# Patient Record
Sex: Female | Born: 1940 | Race: White | Hispanic: No | Smoking: Never smoker
Health system: Southern US, Community
[De-identification: ages and names within clinical notes are randomized; demographics above are authoritative.]

---

## 2015-01-07 ENCOUNTER — Emergency Department (HOSPITAL_COMMUNITY)
Admission: EM | Admit: 2015-01-07 | Discharge: 2015-01-07 | Disposition: A | Discharge status: Home / Self Care | Payer: Medicare Other | Attending: Emergency Medicine | Admitting: Emergency Medicine

## 2015-01-07 ENCOUNTER — Encounter (HOSPITAL_COMMUNITY): Payer: Self-pay | Admitting: Emergency Medicine

## 2015-01-07 ENCOUNTER — Emergency Department (HOSPITAL_COMMUNITY): Payer: Medicare Other

## 2015-01-07 DIAGNOSIS — Y999 Unspecified external cause status: Secondary | ICD-10-CM | POA: Insufficient documentation

## 2015-01-07 DIAGNOSIS — S60811A Abrasion of right wrist, initial encounter: Secondary | ICD-10-CM | POA: Insufficient documentation

## 2015-01-07 DIAGNOSIS — Y9389 Activity, other specified: Secondary | ICD-10-CM | POA: Diagnosis not present

## 2015-01-07 DIAGNOSIS — S62101B Fracture of unspecified carpal bone, right wrist, initial encounter for open fracture: Secondary | ICD-10-CM

## 2015-01-07 DIAGNOSIS — S52571B Other intraarticular fracture of lower end of right radius, initial encounter for open fracture type I or II: Secondary | ICD-10-CM | POA: Diagnosis not present

## 2015-01-07 DIAGNOSIS — W01198A Fall on same level from slipping, tripping and stumbling with subsequent striking against other object, initial encounter: Secondary | ICD-10-CM | POA: Insufficient documentation

## 2015-01-07 DIAGNOSIS — Z23 Encounter for immunization: Secondary | ICD-10-CM | POA: Insufficient documentation

## 2015-01-07 DIAGNOSIS — Y9289 Other specified places as the place of occurrence of the external cause: Secondary | ICD-10-CM | POA: Diagnosis not present

## 2015-01-07 DIAGNOSIS — S6991XA Unspecified injury of right wrist, hand and finger(s), initial encounter: Secondary | ICD-10-CM | POA: Diagnosis present

## 2015-01-07 LAB — BASIC METABOLIC PANEL
ANION GAP: 10 (ref 5–15)
BUN: 15 mg/dL (ref 6–20)
CALCIUM: 9.6 mg/dL (ref 8.9–10.3)
CO2: 27 mmol/L (ref 22–32)
Chloride: 99 mmol/L — ABNORMAL LOW (ref 101–111)
Creatinine, Ser: 1.04 mg/dL — ABNORMAL HIGH (ref 0.44–1.00)
GFR calc non Af Amer: 52 mL/min — ABNORMAL LOW (ref >60)
GFR, EST AFRICAN AMERICAN: 60 mL/min — AB (ref >60)
Glucose, Bld: 93 mg/dL (ref 65–99)
Potassium: 4.2 mmol/L (ref 3.5–5.1)
SODIUM: 136 mmol/L (ref 135–145)

## 2015-01-07 LAB — CBC WITH DIFFERENTIAL/PLATELET
BASOS ABS: 0 10*3/uL (ref 0.0–0.1)
BASOS PCT: 0 % (ref 0–1)
Eosinophils Absolute: 0.1 10*3/uL (ref 0.0–0.7)
Eosinophils Relative: 1 % (ref 0–5)
HEMATOCRIT: 35 % — AB (ref 36.0–46.0)
HEMOGLOBIN: 11.7 g/dL — AB (ref 12.0–15.0)
Lymphocytes Relative: 11 % — ABNORMAL LOW (ref 12–46)
Lymphs Abs: 1.1 10*3/uL (ref 0.7–4.0)
MCH: 31 pg (ref 26.0–34.0)
MCHC: 33.4 g/dL (ref 30.0–36.0)
MCV: 92.6 fL (ref 78.0–100.0)
MONO ABS: 0.5 10*3/uL (ref 0.1–1.0)
Monocytes Relative: 5 % (ref 3–12)
NEUTROS ABS: 8.7 10*3/uL — AB (ref 1.7–7.7)
NEUTROS PCT: 83 % — AB (ref 43–77)
Platelets: 177 10*3/uL (ref 150–400)
RBC: 3.78 MIL/uL — ABNORMAL LOW (ref 3.87–5.11)
RDW: 13.9 % (ref 11.5–15.5)
WBC: 10.4 10*3/uL (ref 4.0–10.5)

## 2015-01-07 MED ORDER — CEFAZOLIN SODIUM 1-5 GM-% IV SOLN
1.0000 g | Freq: Once | INTRAVENOUS | Status: AC
  Administered 2015-01-07: 1 g via INTRAVENOUS

## 2015-01-07 MED ORDER — CEFAZOLIN SODIUM 1-5 GM-% IV SOLN
1.0000 g | Freq: Once | INTRAVENOUS | Status: DC
  Filled 2015-01-07: qty 50

## 2015-01-07 MED ORDER — BUPIVACAINE HCL (PF) 0.5 % IJ SOLN
20.0000 mL | Freq: Once | INTRAMUSCULAR | Status: AC
  Administered 2015-01-07: 20 mL
  Filled 2015-01-07: qty 20

## 2015-01-07 MED ORDER — TETANUS-DIPHTH-ACELL PERTUSSIS 5-2.5-18.5 LF-MCG/0.5 IM SUSP
0.5000 mL | Freq: Once | INTRAMUSCULAR | Status: AC
  Administered 2015-01-07: 0.5 mL via INTRAMUSCULAR
  Filled 2015-01-07: qty 0.5

## 2015-01-07 NOTE — ED Provider Notes (Signed)
CSN: [id number]     Arrival date & time 01/07/15  0806 History   First MD Initiated Contact with Patient 01/07/15 1012     Chief Complaint  Patient presents with  . Fall  . Wrist Pain     (Consider location/radiation/quality/duration/timing/severity/associated sxs/prior Treatment) HPI   Medicare pt, last ate last night. Madison Young 74 y.o.female  PCP: No primary care provider on file.  Pulse 61, temperature 97.8 F (36.6 C), temperature source Oral, resp. rate 16, height 5' 1.75" (1.568 m), weight 98 lb (44.453 kg), SpO2 97 %.  SIGNIFICANT PMH: None CHIEF COMPLAINT: wrist pain  When: 8:30 am this morning How: patient was walking and tripped on her shoelaces, fell landing on her right wrist Chronicity: acute Location: right wrist Radiation: None Quality and severity: Pt denies pain or wanting pan medications Alleviating factors: wrist Worsening factors: movement and touching Treatments tried: ice and rest Associated Symptoms: pain Negative ROS: Confusion, diaphoresis, fever, headache, weakness (general or focal), change of vision,  neck pain, dysphagia, aphagia, chest pain, shortness of breath,  back pain, abdominal pains, nausea, vomiting, diarrhea, lower extremity swelling, rash.   Denies head or neck injury, no loc.   History reviewed. No pertinent past medical history. History reviewed. No pertinent past surgical history. No family history on file. Social History  Substance Use Topics  . Smoking status: Never Smoker   . Smokeless tobacco: None  . Alcohol Use: Yes   OB History    No data available     Review of Systems  10 Systems reviewed and are negative for acute change except as noted in the HPI.     Allergies  Review of patient's allergies indicates no known allergies.  Home Medications   Prior to Admission medications   Not on File   BP 134/64 mmHg  Pulse 60  Temp(Src) 97.8 F (36.6 C) (Oral)  Resp 16  Ht 5' 1.75" (1.568 m)  Wt 98 lb  (44.453 kg)  BMI 18.08 kg/m2  SpO2 100% Physical Exam  Constitutional: She appears well-developed and well-nourished. No distress.  HENT:  Head: Normocephalic and atraumatic.  Eyes: Pupils are equal, round, and reactive to light.  Neck: Normal range of motion. Neck supple.  Cardiovascular: Normal rate and regular rhythm.   Pulmonary/Chest: Effort normal.  Abdominal: Soft.  Musculoskeletal:       Right wrist: She exhibits decreased range of motion, tenderness, bony tenderness, swelling, effusion, crepitus and deformity (abrasion overlying the distal ulnar head. No exposed bone. but + open fracture). She exhibits no laceration.  Cap refill less than 3 seconds Radial pulse palpable.  Neurological: She is alert.  Skin: Skin is warm and dry.  Nursing note and vitals reviewed.     ED Course  Reduction of fracture Date/Time: 01/07/2015 12:59 PM Performed by: Marlon Pel Authorized by: Marlon Pel Consent: Verbal consent obtained. Risks and benefits: risks, benefits and alternatives were discussed Consent given by: patient Patient understanding: patient states understanding of the procedure being performed Patient consent: the patient's understanding of the procedure matches consent given Patient identity confirmed: verbally with patient and arm band Time out: Immediately prior to procedure a "time out" was called to verify the correct patient, procedure, equipment, support staff and site/side marked as required. Preparation: Patient was prepped and draped in the usual sterile fashion. Local anesthesia used: yes Anesthesia: hematoma block Local anesthetic: bupivacaine 0.5% without epinephrine Anesthetic total: 10 ml Patient sedated: no Patient tolerance: Patient tolerated the procedure well with no  immediate complications Comments: Significant improvement in anatomic alignment. Good color. Radial pulse intact.   (including critical care time) Labs Review Labs Reviewed   CBC WITH DIFFERENTIAL/PLATELET - Abnormal; Notable for the following:    RBC 3.78 (*)    Hemoglobin 11.7 (*)    HCT 35.0 (*)    Neutrophils Relative % 83 (*)    Neutro Abs 8.7 (*)    Lymphocytes Relative 11 (*)    All other components within normal limits  BASIC METABOLIC PANEL - Abnormal; Notable for the following:    Chloride 99 (*)    Creatinine, Ser 1.04 (*)    GFR calc non Af Amer 52 (*)    GFR calc Af Amer 60 (*)    All other components within normal limits    Imaging Review Dg Wrist Complete Right  01/07/2015   CLINICAL DATA:  Slipped and fell  EXAM: RIGHT WRIST - COMPLETE 3+ VIEW  COMPARISON:  None.  FINDINGS: There is a comminuted intra-articular fracture of the distal right radius. Positive ulnar variance is noted with abnormal articulation of the ulna with the carpus and may indicate ligamentous injury. Widening of the scapholunate distance is present measuring 4 mm. Degenerative change at the articulation of the scaphoid and trapezium noted. Apex dorsal angulation of the fracture fragments.  IMPRESSION: Complex distal radial intra-articular fracture with positive ulnar variance and suggestion of scapholunate ligament tear.   Electronically Signed   By: Christiana Pellant M.D.   On: 01/07/2015 09:18   I have personally reviewed and evaluated these images and lab results as part of my medical decision-making.   EKG Interpretation None     MDM   Final diagnoses:  Open wrist fracture, right, initial encounter   Patients xray shows  IMPRESSION: Complex distal radial intra-articular fracture with positive ulnar variance and suggestion of scapholunate ligament tear.   Electronically Signed   By: Christiana Pellant M.D.   On: 01/07/2015 09:18   Discussed case with Dr. Clydene Pugh. We will call Hand Surgery.  Dr Clydene Pugh spoke with Dr. Izora Ribas and told pt has open fx- he recommends Ancef and f/u as outpatient.   Will attempt to fixate wrist in ED with Dr. Clydene Pugh using arthralgic block with  marcaine.   Patient placed in finger traction and had significant improvement of deformity. Wound cleaned thoroughly and covered with non stick gauze.Splint placed by Ortho Technician. She was offered and Rx for pain medication but requests I do not write for it because her son has addiction problems and is afraid to get a prescription.  Medications  ceFAZolin (ANCEF) IVPB 1 g/50 mL premix (not administered)  Tdap (BOOSTRIX) injection 0.5 mL (not administered)  bupivacaine (MARCAINE) 0.5 % injection 20 mL (20 mLs Infiltration Given by Other 01/07/15 1133)    74 y.o.Talbert Forest Cifelli's evaluation in the Emergency Department is complete. It has been determined that no acute conditions requiring further emergency intervention are present at this time. The patient/guardian have been advised of the diagnosis and plan. We have discussed signs and symptoms that warrant return to the ED, such as changes or worsening in symptoms.  Vital signs are stable at discharge. Filed Vitals:   01/07/15 1210  BP: 134/64  Pulse: 60  Temp:   Resp: 16    Patient/guardian has voiced understanding and agreed to follow-up with the PCP or specialist.    Marlon Pel, PA-C 01/07/15 1302  Lyndal Pulley, MD 01/07/15 2110

## 2015-01-07 NOTE — Discharge Instructions (Signed)
Cast or Splint Care °Casts and splints support injured limbs and keep bones from moving while they heal. It is important to care for your cast or splint at home.   °HOME CARE INSTRUCTIONS °· Keep the cast or splint uncovered during the drying period. It can take 24 to 48 hours to dry if it is made of plaster. A fiberglass cast will dry in less than 1 hour. °· Do not rest the cast on anything harder than a pillow for the first 24 hours. °· Do not put weight on your injured limb or apply pressure to the cast until your health care provider gives you permission. °· Keep the cast or splint dry. Wet casts or splints can lose their shape and may not support the limb as well. A wet cast that has lost its shape can also create harmful pressure on your skin when it dries. Also, wet skin can become infected. °· Cover the cast or splint with a plastic bag when bathing or when out in the rain or snow. If the cast is on the trunk of the body, take sponge baths until the cast is removed. °· If your cast does become wet, dry it with a towel or a blow dryer on the cool setting only. °· Keep your cast or splint clean. Soiled casts may be wiped with a moistened cloth. °· Do not place any hard or soft foreign objects under your cast or splint, such as cotton, toilet paper, lotion, or powder. °· Do not try to scratch the skin under the cast with any object. The object could get stuck inside the cast. Also, scratching could lead to an infection. If itching is a problem, use a blow dryer on a cool setting to relieve discomfort. °· Do not trim or cut your cast or remove padding from inside of it. °· Exercise all joints next to the injury that are not immobilized by the cast or splint. For example, if you have a long leg cast, exercise the hip joint and toes. If you have an arm cast or splint, exercise the shoulder, elbow, thumb, and fingers. °· Elevate your injured arm or leg on 1 or 2 pillows for the first 1 to 3 days to decrease  swelling and pain. It is best if you can comfortably elevate your cast so it is higher than your heart. °SEEK MEDICAL CARE IF:  °· Your cast or splint cracks. °· Your cast or splint is too tight or too loose. °· You have unbearable itching inside the cast. °· Your cast becomes wet or develops a soft spot or area. °· You have a bad smell coming from inside your cast. °· You get an object stuck under your cast. °· Your skin around the cast becomes red or raw. °· You have new pain or worsening pain after the cast has been applied. °SEEK IMMEDIATE MEDICAL CARE IF:  °· You have fluid leaking through the cast. °· You are unable to move your fingers or toes. °· You have discolored (blue or white), cool, painful, or very swollen fingers or toes beyond the cast. °· You have tingling or numbness around the injured area. °· You have severe pain or pressure under the cast. °· You have any difficulty with your breathing or have shortness of breath. °· You have chest pain. °Document Released: 04/11/2000 Document Revised: 02/02/2013 Document Reviewed: 10/21/2012 °ExitCare® Patient Information ©2015 [employer]. This information is not intended to replace advice given to you by your health care   provider. Make sure you discuss any questions you have with your health care provider. ° °Wrist Fracture °A wrist fracture is a break or crack in one of the bones of your wrist. Your wrist is made up of eight small bones at the palm of your hand (carpal bones) and two long bones that make up your forearm (radius and ulna).  °CAUSES  °· A direct blow to the wrist. °· Falling on an outstretched hand. °· Trauma, such as a car accident or a fall. °RISK FACTORS °Risk factors for wrist fracture include:  °· Participating in contact and high-risk sports, such as skiing, biking, and ice skating. °· Taking steroid medicines. °· Smoking. °· Being female. °· Being Caucasian. °· Drinking more than three alcoholic beverages per day. °· Having low or  lowered bone density (osteoporosis or osteopenia). °· Age. Older adults have decreased bone density. °· Women who have had menopause. °· History of previous fractures. °SIGNS AND SYMPTOMS °Symptoms of wrist fractures include tenderness, bruising, and inflammation. Additionally, the wrist may hang in an odd position or appear deformed.  °DIAGNOSIS °Diagnosis may include: °· Physical exam. °· X-ray. °TREATMENT °Treatment depends on many factors, including the nature and location of the fracture, your age, and your activity level. Treatment for wrist fracture can be nonsurgical or surgical.  °Nonsurgical Treatment °A plaster cast or splint may be applied to your wrist if the bone is in a good position. If the fracture is not in good position, it may be necessary for your health care provider to realign it before applying a splint or cast. Usually, a cast or splint will be worn for several weeks.  °Surgical Treatment °Sometimes the position of the bone is so far out of place that surgery is required to apply a device to hold it together as it heals. Depending on the fracture, there are a number of options for holding the bone in place while it heals, such as a cast and metal pins.   °HOME CARE INSTRUCTIONS °· Keep your injured wrist elevated and move your fingers as much as possible. °· Do not put pressure on any part of your cast or splint. It may break.   °· Use a plastic bag to protect your cast or splint from water while bathing or showering. Do not lower your cast or splint into water. °· Take medicines only as directed by your health care provider. °· Keep your cast or splint clean and dry. If it becomes wet, damaged, or suddenly feels too tight, contact your health care provider right away. °· Do not use any tobacco products including cigarettes, chewing tobacco, or electronic cigarettes. Tobacco can delay bone healing. If you need help quitting, ask your health care provider. °· Keep all follow-up visits as  directed by your health care provider. This is important. °· Ask your health care provider if you should take supplements of calcium and vitamins C and D to promote bone healing. °SEEK MEDICAL CARE IF:  °· Your cast or splint is damaged, breaks, or gets wet. °· You have a fever. °· You have chills. °· You have continued severe pain or more swelling than you did before the cast was put on. °SEEK IMMEDIATE MEDICAL CARE IF:  °· Your hand or fingernails on the injured arm turn blue or gray, or feel cold or numb. °· You have decreased feeling in the fingers of your injured arm. °MAKE SURE YOU: °· Understand these instructions. °· Will watch your condition. °· Will get help   right away if you are not doing well or get worse. °Document Released: 01/22/2005 Document Revised: 08/29/2013 Document Reviewed: 05/02/2011 °ExitCare® Patient Information ©2015 [employer]. This information is not intended to replace advice given to you by your health care provider. Make sure you discuss any questions you have with your health care provider. ° °

## 2015-01-07 NOTE — Progress Notes (Signed)
Orthopedic Tech Progress Note Patient Details:  Madison Young May 12, 1940 [id number]  Ortho Devices Type of Ortho Device: Ace wrap, Sugartong splint, Arm sling Ortho Device/Splint Interventions: Application   Cammer, Mickie Bail 01/07/2015, 1:25 PM

## 2015-01-07 NOTE — ED Notes (Signed)
Pt is in stable condition upon d/c and ambulates from ED,

## 2015-01-07 NOTE — Progress Notes (Signed)
Orthopedic Tech Progress Note Patient Details:  Madison Young [birth year] [id number]  Ortho Devices Type of Ortho Device: Finger trap, Ace wrap Ortho Device/Splint Interventions: Application   Cammer, Mickie Bail 01/07/2015, 12:32 PM

## 2015-01-07 NOTE — ED Notes (Signed)
Pt. Stated, I fell this morning and I hurt my rt. Wrist.

## 2015-01-07 NOTE — ED Notes (Signed)
Ice applied to right wrist.

## 2015-01-08 ENCOUNTER — Telehealth: Payer: Self-pay | Admitting: *Deleted

## 2015-01-08 NOTE — Telephone Encounter (Signed)
NCM consulted by Ellin Saba, PA concerning not being able to reach pt to prescribe Rx.  PA states she dials number on chart; sounds like someone answers and hangs up.  NCM called number on chart; pt answered; NCM asked pt for preferred pharmacy location; pt states [facility] on [address].  NCM states that PA will call in Rx antibiotic.  Pt verbalized understanding.  Pt states fingers are swollen and she has MD appointment tomorrow.  NCM advised pt to keep had elevated.  Pt verbalizes understanding.

## 2016-09-15 IMAGING — DX DG WRIST COMPLETE 3+V*R*
3 series · 3 of 3 positions shown · non-contrast
Comparison: None.

CLINICAL DATA: Slipped and fell

EXAM:
RIGHT WRIST - COMPLETE 3+ VIEW

[x wrist pa right]
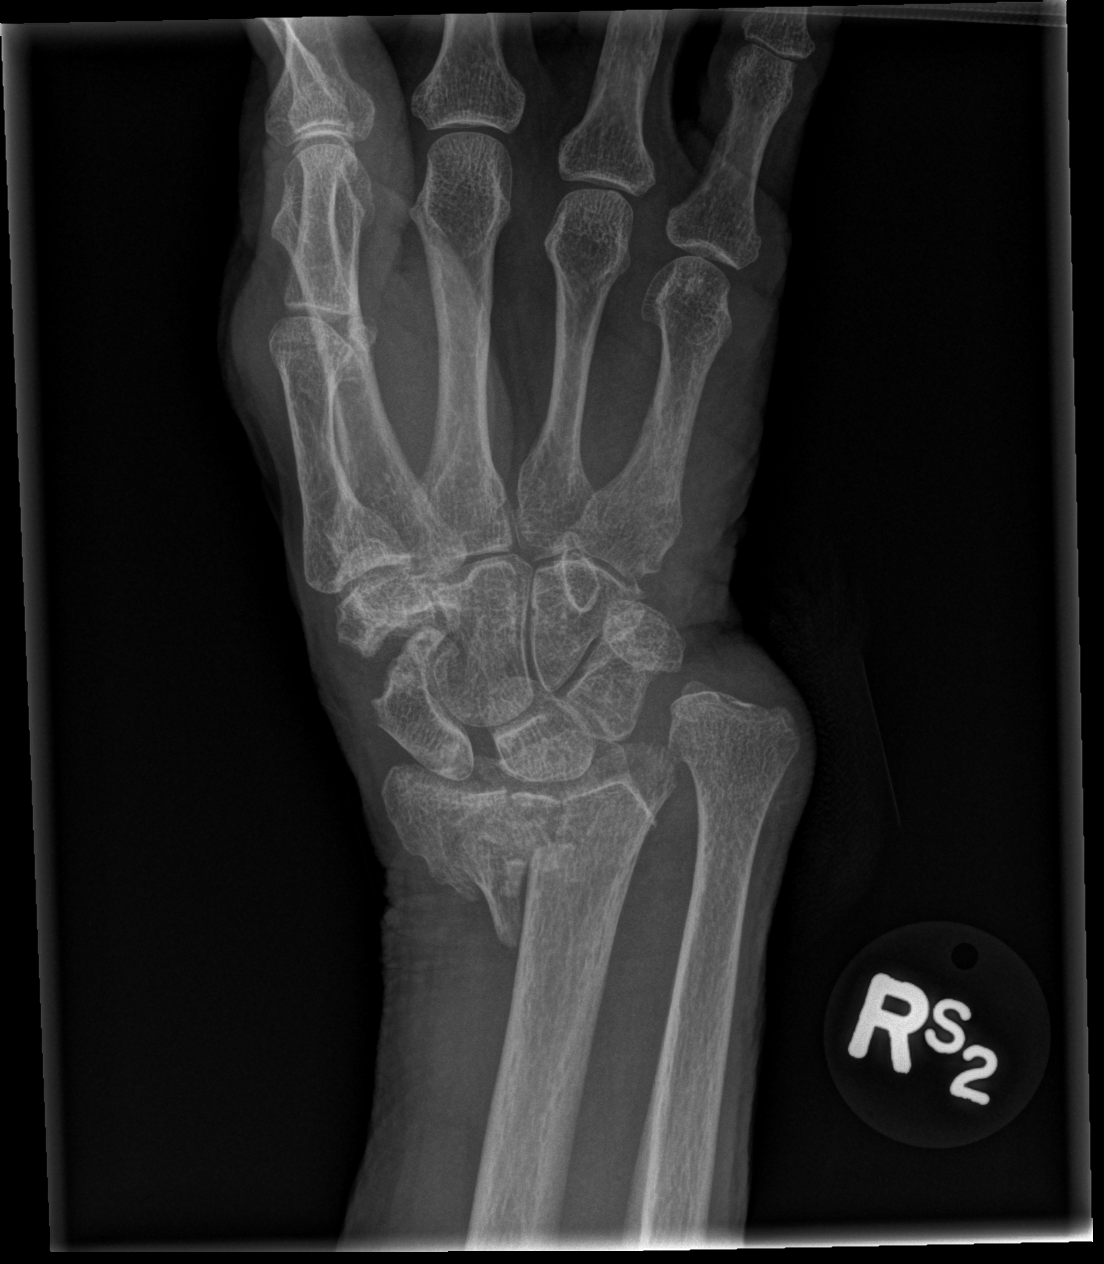

[x wrist obl right]
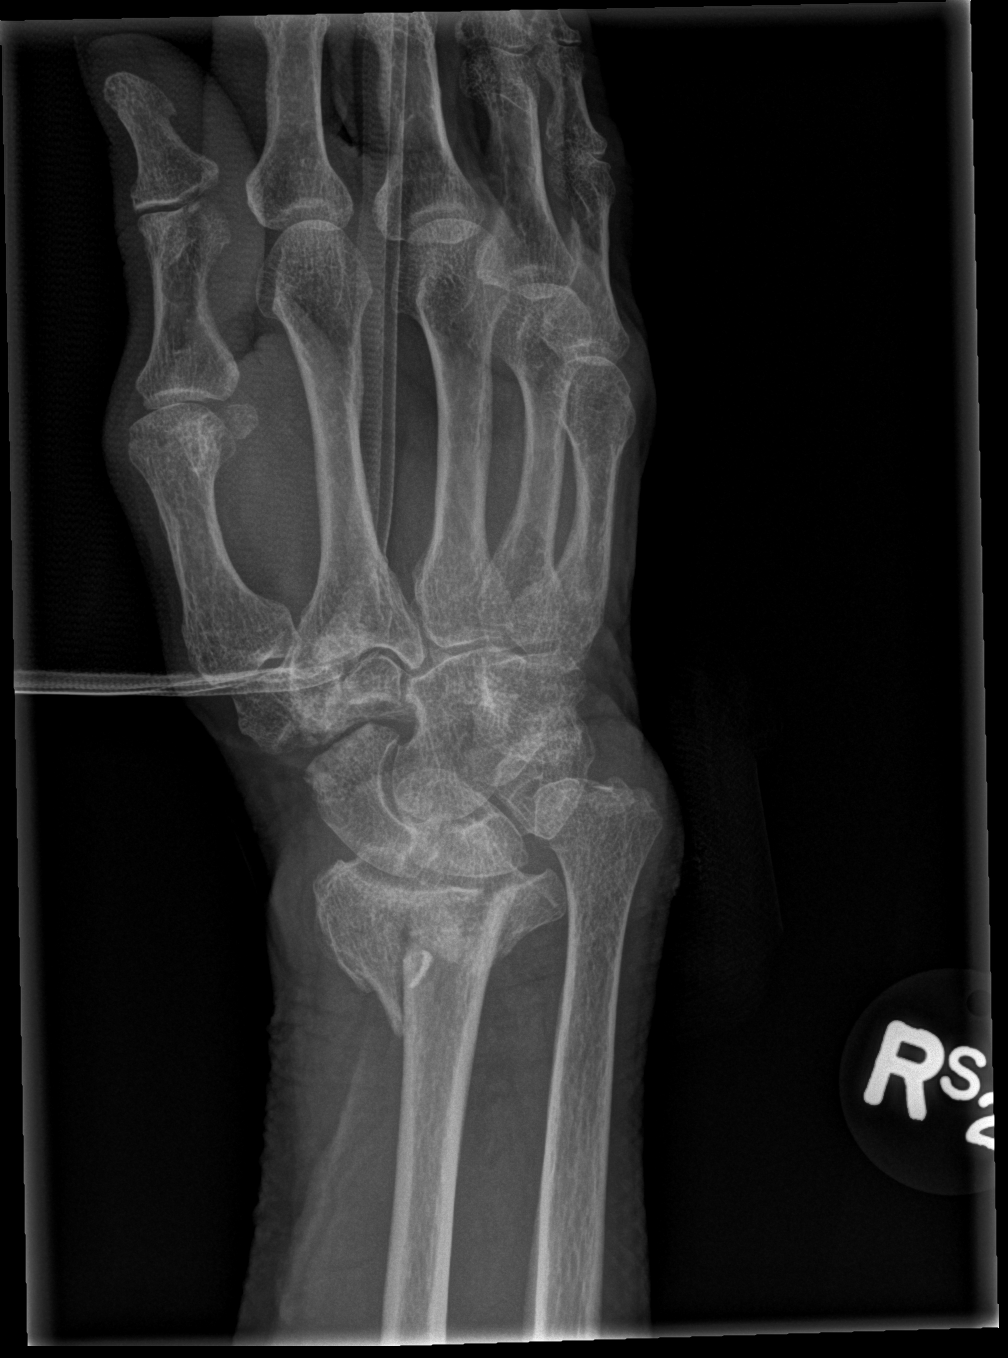

[x wrist lat right]
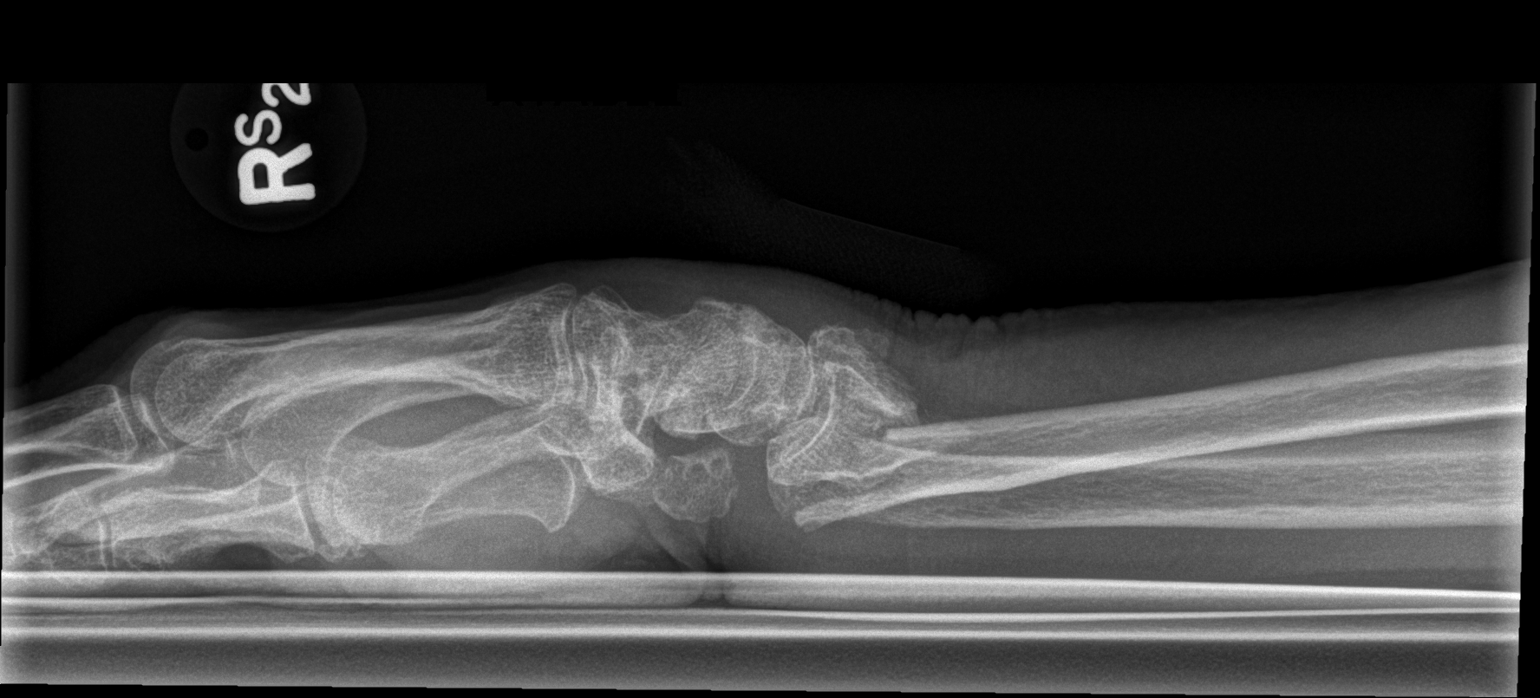

[3 of 3 positions shown; findings below may reference images not displayed]

FINDINGS: There is a comminuted intra-articular fracture of the distal right
radius. Positive ulnar variance is noted with abnormal articulation
of the ulna with the carpus and may indicate ligamentous injury.
Widening of the scapholunate distance is present measuring 4 mm.
Degenerative change at the articulation of the scaphoid and
trapezium noted. Apex dorsal angulation of the fracture fragments.
IMPRESSION: Complex distal radial intra-articular fracture with positive ulnar
variance and suggestion of scapholunate ligament tear.

## 2018-06-30 ENCOUNTER — Encounter (HOSPITAL_COMMUNITY): Payer: Self-pay

## 2018-06-30 ENCOUNTER — Ambulatory Visit (INDEPENDENT_AMBULATORY_CARE_PROVIDER_SITE_OTHER): Payer: Medicare Other

## 2018-06-30 ENCOUNTER — Ambulatory Visit (HOSPITAL_COMMUNITY)
Admission: EM | Admit: 2018-06-30 | Discharge: 2018-06-30 | Disposition: A | Discharge status: Home / Self Care | Payer: Medicare Other | Attending: Family Medicine | Admitting: Family Medicine

## 2018-06-30 DIAGNOSIS — J22 Unspecified acute lower respiratory infection: Secondary | ICD-10-CM

## 2018-06-30 DIAGNOSIS — R05 Cough: Secondary | ICD-10-CM | POA: Diagnosis not present

## 2018-06-30 MED ORDER — AMOXICILLIN 500 MG PO CAPS: 500.0000 mg | ORAL_CAPSULE | Freq: Three times a day (TID) | ORAL | 0 refills | Status: DC

## 2018-06-30 MED ORDER — AZITHROMYCIN 250 MG PO TABS: 250.0000 mg | ORAL_TABLET | Freq: Every day | ORAL | 0 refills | Status: DC

## 2018-06-30 NOTE — Discharge Instructions (Signed)
X-rays showed a pneumonia Get plenty of rest and push fluids Use OTC medications as needed for symptomatic relief of fever and body aches Azithromycin and amoxicillin prescribed.  Take as prescribed and to completion.   Follow up with Joaquin Courts FNP for reevaluation and to ensure your symptoms are improving Return or go to ER if you have any new or worsening symptoms fever, chills, nausea, vomiting, worsening cough, difficulty breathing, symptoms that do not improve with medication, etc...  Recommend repeat x-ray in 6 weeks to exclude underlying malignancy

## 2018-06-30 NOTE — ED Triage Notes (Signed)
Pt presents with non productive cough & congestion since Sunday.  [facility] and they heard congestion and wheezing with check but could not do chest xray to rule out pneumonia.

## 2018-06-30 NOTE — ED Provider Notes (Signed)
[facility]   [id number] 06/30/18 Arrival Time: 1440   CC: URI symptoms   SUBJECTIVE: History from: patient.  Madison Young is a 78 y.o. female PMH significant for previous tobacco use 1 PPD x 20 years, quit in 2006, who presents with abrupt onset of productive cough and congestion x 3 days.  Denies sick exposure or precipitating event.  Has tried aspirin without relief. Reports previous symptoms in the past and diagnosed with flu and pneumonia.   Denies fever, chills, fatigue, sinus pain, rhinorrhea, sore throat, SOB, wheezing, chest pain, nausea, changes in bowel or bladder habits.    Received flu shot this year: no.   ROS: As per HPI.  History reviewed. No pertinent past medical history. History reviewed. No pertinent surgical history. No Known Allergies No current facility-administered medications on file prior to encounter.    No current outpatient medications on file prior to encounter.   Social History   Socioeconomic History  . Marital status: Widowed    Spouse name: Not on file  . Number of children: Not on file  . Years of education: Not on file  . Highest education level: Not on file  Occupational History  . Not on file  Social Needs  . Financial resource strain: Not on file  . Food insecurity:    Worry: Not on file    Inability: Not on file  . Transportation needs:    Medical: Not on file    Non-medical: Not on file  Tobacco Use  . Smoking status: Never Smoker  Substance and Sexual Activity  . Alcohol use: Yes  . Drug use: No  . Sexual activity: Not on file  Lifestyle  . Physical activity:    Days per week: Not on file    Minutes per session: Not on file  . Stress: Not on file  Relationships  . Social connections:    Talks on phone: Not on file    Gets together: Not on file    Attends religious service: Not on file    Active member of club or organization: Not on file    Attends meetings of clubs or organizations: Not on file   Relationship status: Not on file  . Intimate partner violence:    Fear of current or ex partner: Not on file    Emotionally abused: Not on file    Physically abused: Not on file    Forced sexual activity: Not on file  Other Topics Concern  . Not on file  Social History Narrative  . Not on file   Family History  Family history unknown: Yes    OBJECTIVE:  Vitals:   06/30/18 1504  BP: (!) 159/66  Pulse: 93  Resp: 16  Temp: 98.9 F (37.2 C)  TempSrc: Oral  SpO2: 94%     General appearance: alert; frail appearing, but nontoxic; speaking in full sentences and tolerating own secretions HEENT: NCAT; Ears: EACs clear, TMs pearly gray; Eyes: PERRL.  EOM grossly intact. Nose: nares patent without rhinorrhea, Throat: oropharynx clear, tonsils non erythematous or enlarged, uvula midline  Neck: supple without LAD Lungs: fine crackles heard over the anterior aspect of the RT chest Heart: Systolic murmur; radial pulse 2+ Skin: warm and dry Psychological: alert and cooperative; normal mood and affect  DIAGNOSTIC STUDIES:  Dg Chest 2 View  Result Date: 06/30/2018 CLINICAL DATA:  Cough and congestion, shortness of breath for 4 days, fever 102 degrees, sore throat, history pneumonia EXAM: CHEST - 2 VIEW  COMPARISON:  None FINDINGS: Normal heart size, mediastinal contours, and pulmonary vascularity. Atherosclerotic calcification aorta. Emphysematous and bronchitic changes consistent with COPD. Linear subsegmental atelectasis at LEFT base. Minimal biapical scarring. Question subtle LEFT perihilar infiltrate. Remaining lungs clear. No pleural effusion or pneumothorax. Bones demineralized. IMPRESSION: COPD changes with subsegmental atelectasis at LEFT base and question mild LEFT perihilar infiltrate. Electronically Signed   By: Ulyses Southward M.D.   On: 06/30/2018 16:48     ASSESSMENT & PLAN:  1. Lower respiratory tract infection     Meds ordered this encounter  Medications  . amoxicillin  (AMOXIL) 500 MG capsule    Sig: Take 1 capsule (500 mg total) by mouth 3 (three) times daily.    Dispense:  21 capsule    Refill:  0    Order Specific Question:   Supervising Provider    Answer:   Eustace Moore [[id number]]  . azithromycin (ZITHROMAX) 250 MG tablet    Sig: Take 1 tablet (250 mg total) by mouth daily. Take first 2 tablets together, then 1 every day until finished.    Dispense:  6 tablet    Refill:  0    Order Specific Question:   Supervising Provider    Answer:   Eustace Moore [[id number]]   X-rays showed a pneumonia Get plenty of rest and push fluids Use OTC medications as needed for symptomatic relief of fever and body aches Azithromycin and amoxicillin prescribed.  Take as prescribed and to completion.   Follow up with Joaquin Courts FNP for reevaluation and to ensure your symptoms are improving Return or go to ER if you have any new or worsening symptoms fever, chills, nausea, vomiting, worsening cough, difficulty breathing, symptoms that do not improve with medication, etc...  Recommend repeat x-ray in 6 weeks to exclude underlying malignancy   Reviewed expectations re: course of current medical issues. Questions answered. Outlined signs and symptoms indicating need for more acute intervention. Patient verbalized understanding. After Visit Summary given.         Rennis Harding, PA-C 06/30/18 1659

## 2018-11-29 ENCOUNTER — Other Ambulatory Visit: Payer: Self-pay

## 2018-11-29 ENCOUNTER — Ambulatory Visit (HOSPITAL_COMMUNITY)
Admission: EM | Admit: 2018-11-29 | Discharge: 2018-11-29 | Disposition: A | Discharge status: Home / Self Care | Payer: Medicare Other | Attending: Family Medicine | Admitting: Family Medicine

## 2018-11-29 ENCOUNTER — Encounter (HOSPITAL_COMMUNITY): Payer: Self-pay | Admitting: Emergency Medicine

## 2018-11-29 DIAGNOSIS — L03115 Cellulitis of right lower limb: Secondary | ICD-10-CM

## 2018-11-29 MED ORDER — CEPHALEXIN 500 MG PO CAPS: 500.0000 mg | ORAL_CAPSULE | Freq: Four times a day (QID) | ORAL | 0 refills | Status: AC

## 2018-11-29 NOTE — Discharge Instructions (Addendum)
Treating you for a skin infection Take the medication as prescribed  Follow up as needed for continued or worsening symptoms
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
## 2019-02-25 ENCOUNTER — Emergency Department (HOSPITAL_COMMUNITY): Payer: Medicare Other

## 2019-02-25 ENCOUNTER — Inpatient Hospital Stay (HOSPITAL_COMMUNITY)
Admission: EM | Admit: 2019-02-25 | Discharge: 2019-02-27 | DRG: 522 | Disposition: A | Discharge status: Home Health | Payer: Medicare Other | Attending: Internal Medicine | Admitting: Internal Medicine

## 2019-02-25 ENCOUNTER — Inpatient Hospital Stay (HOSPITAL_COMMUNITY): Payer: Medicare Other

## 2019-02-25 ENCOUNTER — Encounter (HOSPITAL_COMMUNITY): Payer: Self-pay

## 2019-02-25 ENCOUNTER — Inpatient Hospital Stay (HOSPITAL_COMMUNITY): Payer: Medicare Other | Admitting: Certified Registered Nurse Anesthetist

## 2019-02-25 ENCOUNTER — Encounter (HOSPITAL_COMMUNITY)
Admission: EM | Disposition: A | Discharge status: Home Health | Payer: Self-pay | Source: Home / Self Care | Attending: Family Medicine

## 2019-02-25 ENCOUNTER — Other Ambulatory Visit: Payer: Self-pay

## 2019-02-25 DIAGNOSIS — Z419 Encounter for procedure for purposes other than remedying health state, unspecified: Secondary | ICD-10-CM | POA: Diagnosis not present

## 2019-02-25 DIAGNOSIS — Z87891 Personal history of nicotine dependence: Secondary | ICD-10-CM | POA: Diagnosis not present

## 2019-02-25 DIAGNOSIS — Z79899 Other long term (current) drug therapy: Secondary | ICD-10-CM | POA: Diagnosis not present

## 2019-02-25 DIAGNOSIS — Y92480 Sidewalk as the place of occurrence of the external cause: Secondary | ICD-10-CM

## 2019-02-25 DIAGNOSIS — S72001A Fracture of unspecified part of neck of right femur, initial encounter for closed fracture: Secondary | ICD-10-CM | POA: Diagnosis present

## 2019-02-25 DIAGNOSIS — K08109 Complete loss of teeth, unspecified cause, unspecified class: Secondary | ICD-10-CM | POA: Diagnosis present

## 2019-02-25 DIAGNOSIS — W1830XA Fall on same level, unspecified, initial encounter: Secondary | ICD-10-CM | POA: Diagnosis present

## 2019-02-25 DIAGNOSIS — D62 Acute posthemorrhagic anemia: Secondary | ICD-10-CM | POA: Diagnosis not present

## 2019-02-25 DIAGNOSIS — Z20828 Contact with and (suspected) exposure to other viral communicable diseases: Secondary | ICD-10-CM | POA: Diagnosis present

## 2019-02-25 DIAGNOSIS — S7291XA Unspecified fracture of right femur, initial encounter for closed fracture: Secondary | ICD-10-CM | POA: Diagnosis not present

## 2019-02-25 DIAGNOSIS — S72041A Displaced fracture of base of neck of right femur, initial encounter for closed fracture: Secondary | ICD-10-CM

## 2019-02-25 DIAGNOSIS — W19XXXA Unspecified fall, initial encounter: Secondary | ICD-10-CM

## 2019-02-25 DIAGNOSIS — Z96641 Presence of right artificial hip joint: Secondary | ICD-10-CM

## 2019-02-25 HISTORY — PX: TOTAL HIP ARTHROPLASTY: SHX124

## 2019-02-25 LAB — BASIC METABOLIC PANEL
Anion gap: 9 (ref 5–15)
BUN: 16 mg/dL (ref 8–23)
CO2: 30 mmol/L (ref 22–32)
Calcium: 9.4 mg/dL (ref 8.9–10.3)
Chloride: 99 mmol/L (ref 98–111)
Creatinine, Ser: 0.92 mg/dL (ref 0.44–1.00)
GFR calc Af Amer: >60 mL/min (ref >60)
GFR calc non Af Amer: 60 mL/min — ABNORMAL LOW (ref >60)
Glucose, Bld: 94 mg/dL (ref 70–99)
Potassium: 3.7 mmol/L (ref 3.5–5.1)
Sodium: 138 mmol/L (ref 135–145)

## 2019-02-25 LAB — CBC WITH DIFFERENTIAL/PLATELET
Abs Immature Granulocytes: 0.03 10*3/uL (ref 0.00–0.07)
Basophils Absolute: 0 10*3/uL (ref 0.0–0.1)
Basophils Relative: 1 %
Eosinophils Absolute: 0.1 10*3/uL (ref 0.0–0.5)
Eosinophils Relative: 1 %
HCT: 34.1 % — ABNORMAL LOW (ref 36.0–46.0)
Hemoglobin: 11.1 g/dL — ABNORMAL LOW (ref 12.0–15.0)
Immature Granulocytes: 1 %
Lymphocytes Relative: 19 %
Lymphs Abs: 1 10*3/uL (ref 0.7–4.0)
MCH: 31.6 pg (ref 26.0–34.0)
MCHC: 32.6 g/dL (ref 30.0–36.0)
MCV: 97.2 fL (ref 80.0–100.0)
Monocytes Absolute: 0.5 10*3/uL (ref 0.1–1.0)
Monocytes Relative: 10 %
Neutro Abs: 3.8 10*3/uL (ref 1.7–7.7)
Neutrophils Relative %: 68 %
Platelets: 198 10*3/uL (ref 150–400)
RBC: 3.51 MIL/uL — ABNORMAL LOW (ref 3.87–5.11)
RDW: 13.7 % (ref 11.5–15.5)
WBC: 5.5 10*3/uL (ref 4.0–10.5)
nRBC: 0 % (ref 0.0–0.2)

## 2019-02-25 LAB — TYPE AND SCREEN
ABO/RH(D): O NEG
Antibody Screen: NEGATIVE

## 2019-02-25 LAB — PROTIME-INR
INR: 0.9 (ref 0.8–1.2)
Prothrombin Time: 12.1 seconds (ref 11.4–15.2)

## 2019-02-25 LAB — SARS CORONAVIRUS 2 BY RT PCR (HOSPITAL ORDER, PERFORMED IN ~~LOC~~ HOSPITAL LAB): SARS Coronavirus 2: NEGATIVE

## 2019-02-25 LAB — ABO/RH: ABO/RH(D): O NEG

## 2019-02-25 SURGERY — ARTHROPLASTY, HIP, TOTAL, ANTERIOR APPROACH
Anesthesia: General | Site: Hip | Laterality: Right

## 2019-02-25 MED ORDER — ONDANSETRON HCL 4 MG/2ML IJ SOLN: 4.0000 mg | Freq: Four times a day (QID) | INTRAMUSCULAR | Status: DC | PRN

## 2019-02-25 MED ORDER — ESMOLOL HCL 100 MG/10ML IV SOLN
INTRAVENOUS | Status: AC
  Filled 2019-02-25: qty 10

## 2019-02-25 MED ORDER — ROCURONIUM BROMIDE 10 MG/ML (PF) SYRINGE
PREFILLED_SYRINGE | INTRAVENOUS | Status: AC
  Filled 2019-02-25: qty 10

## 2019-02-25 MED ORDER — ACETAMINOPHEN 10 MG/ML IV SOLN
INTRAVENOUS | Status: AC
  Filled 2019-02-25: qty 100

## 2019-02-25 MED ORDER — DEXAMETHASONE SODIUM PHOSPHATE 10 MG/ML IJ SOLN
INTRAMUSCULAR | Status: AC
  Filled 2019-02-25: qty 1

## 2019-02-25 MED ORDER — EPHEDRINE 5 MG/ML INJ
INTRAVENOUS | Status: AC
  Filled 2019-02-25: qty 10

## 2019-02-25 MED ORDER — TRANEXAMIC ACID-NACL 1000-0.7 MG/100ML-% IV SOLN
1000.0000 mg | INTRAVENOUS | Status: AC
  Administered 2019-02-25: 1000 mg via INTRAVENOUS

## 2019-02-25 MED ORDER — DEXAMETHASONE SODIUM PHOSPHATE 10 MG/ML IJ SOLN
INTRAMUSCULAR | Status: DC | PRN
  Administered 2019-02-25: 10 mg via INTRAVENOUS

## 2019-02-25 MED ORDER — ONDANSETRON HCL 4 MG/2ML IJ SOLN: 4.0000 mg | Freq: Once | INTRAMUSCULAR | Status: DC | PRN

## 2019-02-25 MED ORDER — STERILE WATER FOR IRRIGATION IR SOLN
Status: DC | PRN
  Administered 2019-02-25: 2000 mL

## 2019-02-25 MED ORDER — ENOXAPARIN SODIUM 40 MG/0.4ML ~~LOC~~ SOLN: 40.0000 mg | SUBCUTANEOUS | Status: DC

## 2019-02-25 MED ORDER — FENTANYL CITRATE (PF) 100 MCG/2ML IJ SOLN
INTRAMUSCULAR | Status: AC
  Filled 2019-02-25: qty 2

## 2019-02-25 MED ORDER — SODIUM CHLORIDE 0.9 % IV SOLN
INTRAVENOUS | Status: DC
  Administered 2019-02-25: 23:00:00 via INTRAVENOUS

## 2019-02-25 MED ORDER — SUCCINYLCHOLINE CHLORIDE 200 MG/10ML IV SOSY
PREFILLED_SYRINGE | INTRAVENOUS | Status: AC
  Filled 2019-02-25: qty 10

## 2019-02-25 MED ORDER — CHLORHEXIDINE GLUCONATE 4 % EX LIQD
60.0000 mL | Freq: Once | CUTANEOUS | Status: AC
  Administered 2019-02-25: 4 via TOPICAL

## 2019-02-25 MED ORDER — PHENYLEPHRINE 40 MCG/ML (10ML) SYRINGE FOR IV PUSH (FOR BLOOD PRESSURE SUPPORT)
PREFILLED_SYRINGE | INTRAVENOUS | Status: DC | PRN
  Administered 2019-02-25: 80 ug via INTRAVENOUS

## 2019-02-25 MED ORDER — SUGAMMADEX SODIUM 200 MG/2ML IV SOLN
INTRAVENOUS | Status: DC | PRN
  Administered 2019-02-25: 100 mg via INTRAVENOUS

## 2019-02-25 MED ORDER — MORPHINE SULFATE (PF) 4 MG/ML IV SOLN: 0.5000 mg | INTRAVENOUS | Status: DC | PRN

## 2019-02-25 MED ORDER — HYDROCODONE-ACETAMINOPHEN 5-325 MG PO TABS: 1.0000 | ORAL_TABLET | Freq: Four times a day (QID) | ORAL | Status: DC | PRN

## 2019-02-25 MED ORDER — ACETAMINOPHEN 325 MG PO TABS: 325.0000 mg | ORAL_TABLET | Freq: Four times a day (QID) | ORAL | Status: DC | PRN

## 2019-02-25 MED ORDER — CEFAZOLIN SODIUM-DEXTROSE 1-4 GM/50ML-% IV SOLN
1.0000 g | Freq: Four times a day (QID) | INTRAVENOUS | Status: AC
  Administered 2019-02-25 – 2019-02-26 (×2): 1 g via INTRAVENOUS
  Filled 2019-02-25 (×3): qty 50

## 2019-02-25 MED ORDER — ONDANSETRON HCL 4 MG/2ML IJ SOLN
INTRAMUSCULAR | Status: AC
  Filled 2019-02-25: qty 2

## 2019-02-25 MED ORDER — ONDANSETRON HCL 4 MG/2ML IJ SOLN
INTRAMUSCULAR | Status: DC | PRN
  Administered 2019-02-25: 4 mg via INTRAVENOUS

## 2019-02-25 MED ORDER — TRAMADOL HCL 50 MG PO TABS
50.0000 mg | ORAL_TABLET | Freq: Four times a day (QID) | ORAL | Status: DC
  Administered 2019-02-25 – 2019-02-27 (×7): 50 mg via ORAL
  Filled 2019-02-25 (×7): qty 1

## 2019-02-25 MED ORDER — METHOCARBAMOL 500 MG IVPB - SIMPLE MED
500.0000 mg | Freq: Four times a day (QID) | INTRAVENOUS | Status: DC | PRN
  Administered 2019-02-25: 500 mg via INTRAVENOUS
  Filled 2019-02-25: qty 50

## 2019-02-25 MED ORDER — ONDANSETRON HCL 4 MG PO TABS: 4.0000 mg | ORAL_TABLET | Freq: Four times a day (QID) | ORAL | Status: DC | PRN

## 2019-02-25 MED ORDER — METHOCARBAMOL 500 MG PO TABS
500.0000 mg | ORAL_TABLET | Freq: Four times a day (QID) | ORAL | Status: DC | PRN
  Administered 2019-02-26 – 2019-02-27 (×2): 500 mg via ORAL
  Filled 2019-02-25 (×2): qty 1

## 2019-02-25 MED ORDER — PHENOL 1.4 % MT LIQD: 1.0000 | OROMUCOSAL | Status: DC | PRN

## 2019-02-25 MED ORDER — FENTANYL CITRATE (PF) 100 MCG/2ML IJ SOLN
25.0000 ug | Freq: Once | INTRAMUSCULAR | Status: AC
  Administered 2019-02-25: 25 ug via INTRAVENOUS
  Filled 2019-02-25: qty 2

## 2019-02-25 MED ORDER — DIPHENHYDRAMINE HCL 12.5 MG/5ML PO ELIX: 12.5000 mg | ORAL_SOLUTION | ORAL | Status: DC | PRN

## 2019-02-25 MED ORDER — POLYETHYLENE GLYCOL 3350 17 G PO PACK: 17.0000 g | PACK | Freq: Every day | ORAL | Status: DC | PRN

## 2019-02-25 MED ORDER — DOCUSATE SODIUM 100 MG PO CAPS
100.0000 mg | ORAL_CAPSULE | Freq: Two times a day (BID) | ORAL | Status: DC
  Administered 2019-02-25 – 2019-02-27 (×4): 100 mg via ORAL
  Filled 2019-02-25 (×4): qty 1

## 2019-02-25 MED ORDER — METHOCARBAMOL 500 MG IVPB - SIMPLE MED
INTRAVENOUS | Status: AC
  Filled 2019-02-25: qty 50

## 2019-02-25 MED ORDER — PROPOFOL 10 MG/ML IV BOLUS
INTRAVENOUS | Status: DC | PRN
  Administered 2019-02-25: 60 mg via INTRAVENOUS

## 2019-02-25 MED ORDER — METOCLOPRAMIDE HCL 5 MG/ML IJ SOLN: 5.0000 mg | Freq: Three times a day (TID) | INTRAMUSCULAR | Status: DC | PRN

## 2019-02-25 MED ORDER — EPHEDRINE SULFATE-NACL 50-0.9 MG/10ML-% IV SOSY
PREFILLED_SYRINGE | INTRAVENOUS | Status: DC | PRN
  Administered 2019-02-25: 5 mg via INTRAVENOUS
  Administered 2019-02-25: 10 mg via INTRAVENOUS

## 2019-02-25 MED ORDER — CEFAZOLIN SODIUM-DEXTROSE 2-4 GM/100ML-% IV SOLN
2.0000 g | INTRAVENOUS | Status: AC
  Administered 2019-02-25: 2 g via INTRAVENOUS
  Filled 2019-02-25: qty 100

## 2019-02-25 MED ORDER — LIDOCAINE 2% (20 MG/ML) 5 ML SYRINGE
INTRAMUSCULAR | Status: AC
  Filled 2019-02-25: qty 5

## 2019-02-25 MED ORDER — ALUM & MAG HYDROXIDE-SIMETH 200-200-20 MG/5ML PO SUSP: 30.0000 mL | ORAL | Status: DC | PRN

## 2019-02-25 MED ORDER — ROCURONIUM BROMIDE 10 MG/ML (PF) SYRINGE
PREFILLED_SYRINGE | INTRAVENOUS | Status: DC | PRN
  Administered 2019-02-25: 30 mg via INTRAVENOUS
  Administered 2019-02-25: 10 mg via INTRAVENOUS

## 2019-02-25 MED ORDER — FENTANYL CITRATE (PF) 250 MCG/5ML IJ SOLN
INTRAMUSCULAR | Status: AC
  Filled 2019-02-25: qty 5

## 2019-02-25 MED ORDER — METHOCARBAMOL 500 MG IVPB - SIMPLE MED: 500.0000 mg | Freq: Four times a day (QID) | INTRAVENOUS | Status: DC | PRN

## 2019-02-25 MED ORDER — LACTATED RINGERS IV SOLN
INTRAVENOUS | Status: DC
  Administered 2019-02-25 (×3): via INTRAVENOUS

## 2019-02-25 MED ORDER — SODIUM CHLORIDE 0.9 % IR SOLN
Status: DC | PRN
  Administered 2019-02-25: 1000 mL

## 2019-02-25 MED ORDER — LIDOCAINE 2% (20 MG/ML) 5 ML SYRINGE
INTRAMUSCULAR | Status: DC | PRN
  Administered 2019-02-25: 40 mg via INTRAVENOUS

## 2019-02-25 MED ORDER — PANTOPRAZOLE SODIUM 40 MG PO TBEC
40.0000 mg | DELAYED_RELEASE_TABLET | Freq: Every day | ORAL | Status: DC
  Administered 2019-02-26 – 2019-02-27 (×2): 40 mg via ORAL
  Filled 2019-02-25 (×2): qty 1

## 2019-02-25 MED ORDER — METOCLOPRAMIDE HCL 5 MG PO TABS
5.0000 mg | ORAL_TABLET | Freq: Three times a day (TID) | ORAL | Status: DC | PRN
Start: 2019-02-25 — End: 2019-02-26

## 2019-02-25 MED ORDER — MENTHOL 3 MG MT LOZG: 1.0000 | LOZENGE | OROMUCOSAL | Status: DC | PRN

## 2019-02-25 MED ORDER — POVIDONE-IODINE 10 % EX SWAB
2.0000 "application " | Freq: Once | CUTANEOUS | Status: AC
  Administered 2019-02-25: 2 via TOPICAL

## 2019-02-25 MED ORDER — ESMOLOL HCL 100 MG/10ML IV SOLN
INTRAVENOUS | Status: DC | PRN
  Administered 2019-02-25: 20 mg via INTRAVENOUS

## 2019-02-25 MED ORDER — FENTANYL CITRATE (PF) 100 MCG/2ML IJ SOLN
INTRAMUSCULAR | Status: DC | PRN
  Administered 2019-02-25 (×3): 50 ug via INTRAVENOUS

## 2019-02-25 MED ORDER — FENTANYL CITRATE (PF) 100 MCG/2ML IJ SOLN
25.0000 ug | INTRAMUSCULAR | Status: DC | PRN
  Administered 2019-02-25 (×2): 25 ug via INTRAVENOUS
  Administered 2019-02-25: 50 ug via INTRAVENOUS

## 2019-02-25 MED ORDER — HYDROCODONE-ACETAMINOPHEN 7.5-325 MG PO TABS: 1.0000 | ORAL_TABLET | ORAL | Status: DC | PRN

## 2019-02-25 MED ORDER — HYDROCODONE-ACETAMINOPHEN 5-325 MG PO TABS
1.0000 | ORAL_TABLET | ORAL | Status: DC | PRN
  Administered 2019-02-26 – 2019-02-27 (×3): 1 via ORAL
  Filled 2019-02-25 (×3): qty 1

## 2019-02-25 MED ORDER — METHOCARBAMOL 500 MG PO TABS: 500.0000 mg | ORAL_TABLET | Freq: Four times a day (QID) | ORAL | Status: DC | PRN

## 2019-02-25 MED ORDER — PROPOFOL 10 MG/ML IV BOLUS
INTRAVENOUS | Status: AC
  Filled 2019-02-25: qty 20

## 2019-02-25 MED ORDER — ASPIRIN 81 MG PO CHEW
81.0000 mg | CHEWABLE_TABLET | Freq: Two times a day (BID) | ORAL | Status: DC
  Administered 2019-02-26 – 2019-02-27 (×3): 81 mg via ORAL
  Filled 2019-02-25 (×3): qty 1

## 2019-02-25 MED ORDER — ACETAMINOPHEN 10 MG/ML IV SOLN
1000.0000 mg | Freq: Once | INTRAVENOUS | Status: DC | PRN
  Administered 2019-02-25: 1000 mg via INTRAVENOUS

## 2019-02-25 MED ORDER — TRANEXAMIC ACID-NACL 1000-0.7 MG/100ML-% IV SOLN
INTRAVENOUS | Status: AC
  Filled 2019-02-25: qty 100

## 2019-02-25 MED ORDER — SUCCINYLCHOLINE CHLORIDE 20 MG/ML IJ SOLN
INTRAMUSCULAR | Status: DC | PRN
  Administered 2019-02-25: 40 mg via INTRAVENOUS
  Administered 2019-02-25: 60 mg via INTRAVENOUS

## 2019-02-25 SURGICAL SUPPLY — 50 items
BAG DECANTER FOR FLEXI CONT (MISCELLANEOUS) ×2 IMPLANT
BAG ZIPLOCK 12X15 (MISCELLANEOUS) ×2 IMPLANT
BENZOIN TINCTURE PRP APPL 2/3 (GAUZE/BANDAGES/DRESSINGS) IMPLANT
BLADE EXTENDED COATED 6.5IN (ELECTRODE) ×2 IMPLANT
BLADE SAW SGTL 18X1.27X75 (BLADE) ×4 IMPLANT
BLADE SURG SZ10 CARB STEEL (BLADE) ×4 IMPLANT
COVER PERINEAL POST (MISCELLANEOUS) ×2 IMPLANT
COVER SURGICAL LIGHT HANDLE (MISCELLANEOUS) ×4 IMPLANT
COVER WAND RF STERILE (DRAPES) ×3 IMPLANT
CUP SECTOR GRIPTON 50MM (Cup) ×1 IMPLANT
DRAPE HIP W/POCKET STRL (MISCELLANEOUS) ×2 IMPLANT
DRAPE POUCH INSTRU U-SHP 10X18 (DRAPES) ×2 IMPLANT
DRAPE STERI IOBAN 125X83 (DRAPES) ×2 IMPLANT
DRAPE U-SHAPE 47X51 STRL (DRAPES) ×6 IMPLANT
DRSG AQUACEL AG ADV 3.5X10 (GAUZE/BANDAGES/DRESSINGS) ×2 IMPLANT
DRSG MEPILEX BORDER 4X8 (GAUZE/BANDAGES/DRESSINGS) ×2 IMPLANT
DURAPREP 26ML APPLICATOR (WOUND CARE) ×2 IMPLANT
ELECT REM PT RETURN 15FT ADLT (MISCELLANEOUS) ×4 IMPLANT
FACESHIELD WRAPAROUND (MASK) ×8 IMPLANT
FACESHIELD WRAPAROUND OR TEAM (MASK) ×4 IMPLANT
GAUZE XEROFORM 1X8 LF (GAUZE/BANDAGES/DRESSINGS) ×2 IMPLANT
GLOVE BIO SURGEON STRL SZ7.5 (GLOVE) ×4 IMPLANT
GLOVE BIOGEL PI IND STRL 8 (GLOVE) ×4 IMPLANT
GLOVE BIOGEL PI INDICATOR 8 (GLOVE) ×4
GLOVE ECLIPSE 8.0 STRL XLNG CF (GLOVE) ×4 IMPLANT
GOWN STRL REUS W/TWL XL LVL3 (GOWN DISPOSABLE) ×6 IMPLANT
HANDPIECE INTERPULSE COAX TIP (DISPOSABLE) ×1
HEAD FEM STD 32X+1 STRL (Hips) ×1 IMPLANT
HOLDER FOLEY CATH W/STRAP (MISCELLANEOUS) ×2 IMPLANT
KIT TURNOVER KIT A (KITS) ×1 IMPLANT
LINER ACETABULAR 32X50 (Liner) ×1 IMPLANT
MARKER SKIN DUAL TIP RULER LAB (MISCELLANEOUS) ×2 IMPLANT
PACK ANTERIOR HIP CUSTOM (KITS) ×2 IMPLANT
PROTECTOR NERVE ULNAR (MISCELLANEOUS) ×2 IMPLANT
SET HNDPC FAN SPRY TIP SCT (DISPOSABLE) ×1 IMPLANT
STAPLER VISISTAT 35W (STAPLE) IMPLANT
STEM CORAIL KA12 (Stem) ×1 IMPLANT
STRIP CLOSURE SKIN 1/2X4 (GAUZE/BANDAGES/DRESSINGS) ×1 IMPLANT
SUT ETHIBOND NAB CT1 #1 30IN (SUTURE) ×6 IMPLANT
SUT ETHILON 2 0 PS N (SUTURE) IMPLANT
SUT MNCRL AB 4-0 PS2 18 (SUTURE) IMPLANT
SUT VIC AB 0 CT1 36 (SUTURE) ×2 IMPLANT
SUT VIC AB 1 CT1 36 (SUTURE) ×6 IMPLANT
SUT VIC AB 2-0 CT1 27 (SUTURE) ×4
SUT VIC AB 2-0 CT1 TAPERPNT 27 (SUTURE) ×4 IMPLANT
SUT VIC AB 4-0 PS2 27 (SUTURE) ×2 IMPLANT
SYR 50ML LL SCALE MARK (SYRINGE) ×2 IMPLANT
TOWEL OR 17X26 10 PK STRL BLUE (TOWEL DISPOSABLE) ×6 IMPLANT
TRAY FOLEY MTR SLVR 14FR STAT (SET/KITS/TRAYS/PACK) ×1 IMPLANT
YANKAUER SUCT BULB TIP 10FT TU (MISCELLANEOUS) ×4 IMPLANT

## 2019-02-25 NOTE — ED Triage Notes (Signed)
Pt arrived [facility] from home CC Fall while walking in neighborhood. Pt denies hitting head or neck pain. Per EMS Pt recalls incident no memory loss and noted right hip pain/abnormalities.    Son Shanon Brow [phone]

## 2019-02-25 NOTE — ED Notes (Signed)
Lama MD at bedside 

## 2019-02-25 NOTE — Op Note (Signed)
[name], [name] MEDICAL RECORD [id number] ACCOUNT [id number] DATE OF BIRTH:11-13-1940 FACILITY: [facility] LOCATION: [facility] PHYSICIAN:Madison Simonis Kerry Fort, MD  OPERATIVE REPORT  DATE OF PROCEDURE:  02/25/2019  PREOPERATIVE DIAGNOSIS:  Acute displaced femoral neck fracture, right hip.  POSTOPERATIVE DIAGNOSIS:  Acute displaced femoral neck fracture, right hip.  PROCEDURE:  Right total hip arthroplasty through direct anterior approach.  IMPLANTS:  DePuy Sector Gription acetabular component size 50, size 32+0 neutral polyethylene liner, size 12 Corail femoral component with standard offset, size 32+1 metal hip ball.  SURGEON:  Madison Young. Madison Linden, MD  ASSISTANT:  Madison Young, RNFA  ANESTHESIA:  General.  ANTIBIOTICS:  Two grams IV Ancef.  ESTIMATED BLOOD LOSS:  150-200 mL.  COMPLICATIONS:  None.  INDICATIONS:  The patient is a very active 78 year old female who is a former smoker.  She is now active and was walking today when she sustained an accidental mechanical fall in her exercise walk.  She injured her right hip and had the inability to  ambulate.  She was brought to the [facility] Emergency Room and found to have a displaced right hip femoral neck fracture.  She was admitted to the medicine service and orthopedic surgery was consulted right away to address this fracture.  We talked to  her about surgery on her fracture since she is having significant amount of pain and it is completely displaced.  We talked about nonoperative and operative treatment options.  She is a very healthy individual and is usually the standard of care for this  age group in someone who is very active as well.  She has no previous hip pain, but I felt that a total hip arthroplasty that was better than a hemiarthroplasty given that she is still quite active and only 78.  We had a long and thorough discussion  about the risk of acute blood loss anemia, nerve or vessel injury,  fracture, infection, dislocation, DVT and implant failure.  We talked about the goals being decreased pain, improve mobility and overall improve quality of life.  DESCRIPTION OF PROCEDURE:  After informed consent was obtained and appropriate right hip was marked, she was brought to the operating room where general anesthesia was obtained while she was on her stretcher.  A Foley catheter was placed and traction  boots were placed on both her feet.  Next, she was placed supine on the Hana fracture table with perineal post in place and both legs in line skeletal traction device and no traction applied.  Her right operative hip was prepped and draped with DuraPrep  and sterile drapes.  A time-out was called to identify correct patient and correct right hip.  We then made an incision just inferior and posterior to the anterior superior iliac spine and carried this obliquely down the leg.  We dissected down tensor  fascia lata muscle.  Tensor fascia was then divided longitudinally to proceed with direct anterior approach to the hip.  We identified and cauterized circumflex vessels.  I then identified the hip capsule, opened the hip capsule in an L-type format  finding a hemarthrosis consistent with a femoral neck fracture and you could see right away she did have a completely displaced femoral neck fracture.  We placed curved retractors around the medial and lateral femoral neck and made our femoral neck cut  just distal to the femoral neck fracture, but proximal to the lesser trochanter.  We completed this with an osteotome and placed a corkscrew guide in the femoral  head and removed the femoral heads entirety.  We then placed a bent Hohmann over the medial  acetabular rim and removed remnants of the acetabular labrum and other debris from the acetabulum and then began reaming under direct visualization from a size 43 reamer, going up to a size 49 with all reamers under direct visualization, the last reamer   under direct fluoroscopy, so we could obtain our depth of reaming, our inclination and anteversion.  I then placed the real DePuy Sector Gription acetabular component size 50 and a 32+0 neutral polyethylene liner for that size 50 acetabular component.   Attention was then turned to the femur.  With the leg externally rotated to 120 degrees, extended and adducted, we were placing Mueller retractor medially and Hohman retractor behind the greater trochanter.  We released the lateral joint capsule and used  a box-cutting osteotome to enter the femoral canal and a rongeur to lateralize.  I then began broaching using the Corail broaching system from a size 8 going up to a size 12.  With a size 12 in place, we trialed a standard offset femoral neck and a 32+1  hip ball.  We brought the leg back over and up and with traction and internal rotation, reducing the pelvis and we were pleased with leg length, offset, range of motion and stability assessed mechanically and visually under direct fluoroscopy.  We then  dislocated the hip and removed the trial components.  We placed the real Corail femoral component size 12 with standard offset and the real 32+1 metal hip ball and again reduced this in the acetabulum.  We appreciated the stability.  We then irrigated  the soft tissue with normal saline solution using pulsatile lavage.  We reapproximated the joint capsule with #1 Ethibond suture, followed by running #1 Vicryl to close the tensor fascia, 0 Vicryl was used to close the deep tissue, 2-0 Vicryl was used to  close subcutaneous tissue and 4-0 Monocryl subcuticular stitch was applied.  Steri-Strips were then placed as well as an Aquacel dressing.  She was taken off the Hana table, awakened, extubated, and taken to recovery room in stable condition.  All final counts were correct.  There were no complications noted.  TN/NUANCE  D:02/25/2019 T:02/25/2019 JOB:[id number]

## 2019-02-25 NOTE — Progress Notes (Signed)
Patient ID: Madison Young, female   DOB: Dec 10, 1940, 78 y.o.   MRN: [id number] I have seen and examined the patient.  I talked to her in the holding area.  I reviewed Orion Crook, PA-C's consultation note and agree with his findings.  The patient does have a displaced right femoral neck fracture.  We are recommending a right total hip arthroplasty through direct anterior approach.  We had a long and thorough discussion about the risk and benefits of surgery.  Informed consent is obtained and the right hip was marked.  Our plan is to proceed to surgery today and hopefully have her start mobilization with physical therapy tomorrow.  All question concerns were answered and addressed.

## 2019-02-25 NOTE — Consult Note (Signed)
Reason for Consult:Right hip fx Referring Physician: D Ray  Madison Young is an 78 y.o. female.  HPI: Madison Young was out walking and fell. She's unsure what caused her to fall. She denies tripping, presyncope, or vertigo. In any event, she could not get back up and called 911. ED evaluation showed a right femoral neck fx and orthopedic surgery was consulted. She c/o localized soreness to right hip. She denies any medical problems but also admits she doesn't go to the doctor.  History reviewed. No pertinent past medical history.  History reviewed. No pertinent surgical history.  Family History  Family history unknown: Yes    Social History:  reports that she has never smoked. She has never used smokeless tobacco. She reports current alcohol use. She reports that she does not use drugs.  Allergies: No Known Allergies  Medications: I have reviewed the patient's current medications.  Results for orders placed or performed during the hospital encounter of 02/25/19 (from the past 48 hour(s))  Basic metabolic panel     Status: Abnormal   Collection Time: 02/25/19 11:19 AM  Result Value Ref Range   Sodium 138 135 - 145 mmol/L   Potassium 3.7 3.5 - 5.1 mmol/L   Chloride 99 98 - 111 mmol/L   CO2 30 22 - 32 mmol/L   Glucose, Bld 94 70 - 99 mg/dL   BUN 16 8 - 23 mg/dL   Creatinine, Ser 9.93 0.44 - 1.00 mg/dL   Calcium 9.4 8.9 - 57.0 mg/dL   GFR calc non Af Amer 60 (L) >60 mL/min   GFR calc Af Amer >60 >60 mL/min   Anion gap 9 5 - 15    Comment: Performed at [facility], [address], [place], [place] [address]  CBC WITH DIFFERENTIAL     Status: Abnormal   Collection Time: 02/25/19 11:19 AM  Result Value Ref Range   WBC 5.5 4.0 - 10.5 K/uL   RBC 3.51 (L) 3.87 - 5.11 MIL/uL   Hemoglobin 11.1 (L) 12.0 - 15.0 g/dL   HCT 90.3 (L) 00.9 - 23.3 %   MCV 97.2 80.0 - 100.0 fL   MCH 31.6 26.0 - 34.0 pg   MCHC 32.6 30.0 - 36.0 g/dL   RDW 00.7 62.2 - 63.3 %   Platelets 198  150 - 400 K/uL   nRBC 0.0 0.0 - 0.2 %   Neutrophils Relative % 68 %   Neutro Abs 3.8 1.7 - 7.7 K/uL   Lymphocytes Relative 19 %   Lymphs Abs 1.0 0.7 - 4.0 K/uL   Monocytes Relative 10 %   Monocytes Absolute 0.5 0.1 - 1.0 K/uL   Eosinophils Relative 1 %   Eosinophils Absolute 0.1 0.0 - 0.5 K/uL   Basophils Relative 1 %   Basophils Absolute 0.0 0.0 - 0.1 K/uL   Immature Granulocytes 1 %   Abs Immature Granulocytes 0.03 0.00 - 0.07 K/uL    Comment: Performed at [facility], [address] [place], [place] [address]  Protime-INR     Status: None   Collection Time: 02/25/19 11:19 AM  Result Value Ref Range   Prothrombin Time 12.1 11.4 - 15.2 seconds   INR 0.9 0.8 - 1.2    Comment: (NOTE) INR goal varies based on device and disease states. Performed at [facility], [address], [place], [place] [address]   ABO/Rh     Status: None   Collection Time: 02/25/19 11:19 AM  Result Value  Ref Range   ABO/RH(D)      Jenetta Downer NEG Performed at [facility] [address], [place], [place] [address]   Type and screen [place]     Status: None   Collection Time: 02/25/19 11:21 AM  Result Value Ref Range   ABO/RH(D) O NEG    Antibody Screen NEG    Sample Expiration      02/28/2019,2359 Performed at [facility], [place] [address], [place], [place] [address]     Dg Chest 1 View  Result Date: 02/25/2019 CLINICAL DATA:  Preop exam.  Fall today. EXAM: CHEST  1 VIEW COMPARISON:  06/30/2018. FINDINGS: Mediastinum and hilar structures normal. Heart size normal. COPD. Bilateral pleural-parenchymal thickening again noted consistent scarring. No pleural effusion or pneumothorax. IMPRESSION: COPD. Bilateral pleural-parenchymal thickening noted consistent scarring. Electronically Signed   By: Marcello Moores  Register   On: 02/25/2019 12:19   Dg Hip Unilat With Pelvis 2-3 Views Right  Result Date:  02/25/2019 CLINICAL DATA:  Right hip pain and right leg shortening following a fall today. EXAM: DG HIP (WITH OR WITHOUT PELVIS) 2-3V RIGHT COMPARISON:  None. FINDINGS: Right femoral neck fracture with varus angulation and proximal displacement of the distal fragment. Atheromatous arterial calcifications. IMPRESSION: Right femoral neck fracture, as described above. Electronically Signed   By: Claudie Revering M.D.   On: 02/25/2019 12:21    Review of Systems  Constitutional: Negative for weight loss.  HENT: Negative for ear discharge, ear pain, hearing loss and tinnitus.   Eyes: Negative for blurred vision, double vision, photophobia and pain.  Respiratory: Negative for cough, sputum production and shortness of breath.   Cardiovascular: Negative for chest pain.  Gastrointestinal: Negative for abdominal pain, nausea and vomiting.  Genitourinary: Negative for dysuria, flank pain, frequency and urgency.  Musculoskeletal: Positive for joint pain (Right hip). Negative for back pain, falls, myalgias and neck pain.  Neurological: Negative for dizziness, tingling, sensory change, focal weakness, loss of consciousness and headaches.  Endo/Heme/Allergies: Does not bruise/bleed easily.  Psychiatric/Behavioral: Negative for depression, memory loss and substance abuse. The patient is not nervous/anxious.    Blood pressure (!) 183/93, pulse 72, temperature 98 F (36.7 C), temperature source Oral, resp. rate 18, SpO2 95 %. Physical Exam  Constitutional: She appears well-developed and well-nourished. No distress.  HENT:  Head: Normocephalic and atraumatic.  Eyes: Conjunctivae are normal. Right eye exhibits no discharge. Left eye exhibits no discharge. No scleral icterus.  Neck: Normal range of motion.  Cardiovascular: Normal rate and regular rhythm.  Respiratory: Effort normal. No respiratory distress.  Musculoskeletal:     Comments: RLE No traumatic wounds, ecchymosis, or rash  Mod TTP hip  No knee or  ankle effusion  Knee stable to varus/ valgus and anterior/posterior stress  Sens DPN, SPN, TN intact  Motor EHL, ext, flex, evers 5/5  DP 2+, PT 2+, No significant edema  Neurological: She is alert.  Skin: Skin is warm and dry. She is not diaphoretic.  Psychiatric: She has a normal mood and affect. Her behavior is normal.    Assessment/Plan: Right hip fx -- Plan THA by Dr. Ninfa Linden this evening. Please keep NPO.    Lisette Abu, PA-C Orthopedic Surgery [phone] 02/25/2019, 3:14 PM

## 2019-02-25 NOTE — ED Notes (Signed)
Patient placed into gown. Belongings in bag at bedside.

## 2019-02-25 NOTE — Progress Notes (Signed)
Patient ID: Madison Young, female   DOB: [birth year], 78 y.o.   MRN: [id number] I am currently in the operating room starting a complex hip case.  I am on call for the emergency room and was consulted on this patient.  From the operating room I was able to review her x-rays.  She does have a displaced complete right hip femoral neck fracture.  My recommendation would be a direct anterior total hip arthroplasty to replace her hip so she could get back to mobility soon.  She should remain n.p.o. for now and hopefully we may be able to proceed with surgery later today pending medical clearance.

## 2019-02-25 NOTE — ED Notes (Signed)
Ray, MD at bedside.  

## 2019-02-25 NOTE — Transfer of Care (Signed)
Immediate Anesthesia Transfer of Care Note  Patient: Madison Young  Procedure(s) Performed: TOTAL HIP ARTHROPLASTY ANTERIOR APPROACH (Right Hip)  Patient Location: PACU  Anesthesia Type:General  Level of Consciousness: awake and patient cooperative  Airway & Oxygen Therapy: Patient Spontanous Breathing and Patient connected to face mask  Post-op Assessment: Report given to RN and Post -op Vital signs reviewed and stable  Post vital signs: Reviewed and stable  Last Vitals:  Vitals Value Taken Time  BP 166/78 02/25/19 1828  Temp    Pulse 80 02/25/19 1830  Resp 12 02/25/19 1830  SpO2 100 % 02/25/19 1830  Vitals shown include unvalidated device data.  Last Pain:  Vitals:   02/25/19 1439  TempSrc:   PainSc: 8          Complications: No apparent anesthesia complications

## 2019-02-25 NOTE — Brief Op Note (Signed)
02/25/2019  5:58 PM  PATIENT:  Madison Young  78 y.o. female  PRE-OPERATIVE DIAGNOSIS:  Right hip fx  POST-OPERATIVE DIAGNOSIS:  Right hip fx  PROCEDURE:  Procedure(s): TOTAL HIP ARTHROPLASTY ANTERIOR APPROACH (Right)  SURGEON:  Surgeon(s) and Role:    Mcarthur Rossetti, MD - Primary  ANESTHESIA:   general  EBL:  150 mL   COUNTS:  YES  PLAN OF CARE: Admit to inpatient   PATIENT DISPOSITION:  PACU - hemodynamically stable.   Delay start of Pharmacological VTE agent (>24hrs) due to surgical blood loss or risk of bleeding: no

## 2019-02-25 NOTE — ED Notes (Signed)
Patient transported to X-ray 

## 2019-02-25 NOTE — ED Notes (Signed)
Patient called out to urinate. Patient placed on pure wick due to limited hip mobility from fall.

## 2019-02-25 NOTE — H&P (Signed)
TRH H&P    Patient Demographics:    Madison Young, is a 78 y.o. female  MRN: [id number]  DOB - 21-Oct-1940  Admit Date - 02/25/2019  Referring MD/NP/PA: Dr. Margarita Grizzle  Outpatient Primary MD for the patient is Patient, No Pcp Per  Patient coming from: Home  Chief complaint-fall   HPI:    Madison Young  is a 78 y.o. female, with no significant medical problems came to hospital with complaints of fall hitting her right hip.  Patient denies passing out.  Denies vertigo, dizziness.  Patient says that after she fell she was unable to get up.  EMS was called and patient was sent to the ED.  In the ED she was found to have femoral neck fracture on the right.  Orthopedic surgery was consulted. She denies chest pain or shortness of breath Denies nausea vomiting or diarrhea Denies abdominal pain No fever chills, no dysuria    Review of systems:    In addition to the HPI above,    All other systems reviewed and are negative.    Past History of the following :       Social History:      Social History   Tobacco Use  . Smoking status: Never Smoker  . Smokeless tobacco: Never Used  Substance Use Topics  . Alcohol use: Yes       Family History :     Family History  Family history unknown: Yes      Home Medications:   Prior to Admission medications   Medication Sig Start Date End Date Taking? Authorizing Provider  CALCIUM PO Take 1 tablet by mouth 2 (two) times daily.   Yes [provider]  Multiple Vitamins-Minerals (PRESERVISION AREDS PO) Take 1 capsule by mouth 2 (two) times daily.   Yes [provider]     Allergies:    No Known Allergies   Physical Exam:   Vitals  Blood pressure (!) 177/83, pulse 78, temperature 98 F (36.7 C), temperature source Oral, resp. rate 17, SpO2 93 %.  1.  General: Appears in no acute distress  2. Psychiatric: Alert,  oriented x 3, intact insight and judgment  3. Neurologic: Cranial nerves II through XII grossly intact, no focal deficit noted  4. HEENMT:  Atraumatic normocephalic, extraocular muscles are intact  5. Respiratory : Clear to auscultation bilaterally, no wheezing or crackles  6. Cardiovascular : S1-S2, regular, no murmur auscultated  7. Gastrointestinal:  Abdomen is soft, nontender, no organomegaly  8. Skin:  No rashes noted  9.Musculoskeletal:  Right hip tenderness to palpation    Data Review:    CBC Recent Labs  Lab 02/25/19 1119  WBC 5.5  HGB 11.1*  HCT 34.1*  PLT 198  MCV 97.2  MCH 31.6  MCHC 32.6  RDW 13.7  LYMPHSABS 1.0  MONOABS 0.5  EOSABS 0.1  BASOSABS 0.0   ------------------------------------------------------------------------------------------------------------------  Results for orders placed or performed during the hospital encounter of 02/25/19 (from the past 48 hour(s))  Basic metabolic panel  Status: Abnormal   Collection Time: 02/25/19 11:19 AM  Result Value Ref Range   Sodium 138 135 - 145 mmol/L   Potassium 3.7 3.5 - 5.1 mmol/L   Chloride 99 98 - 111 mmol/L   CO2 30 22 - 32 mmol/L   Glucose, Bld 94 70 - 99 mg/dL   BUN 16 8 - 23 mg/dL   Creatinine, Ser 1.610.92 0.44 - 1.00 mg/dL   Calcium 9.4 8.9 - 09.610.3 mg/dL   GFR calc non Af Amer 60 (L) >60 mL/min   GFR calc Af Amer >60 >60 mL/min   Anion gap 9 5 - 15    Comment: Performed at [facility], [address] [place], [place] [address]  CBC WITH DIFFERENTIAL     Status: Abnormal   Collection Time: 02/25/19 11:19 AM  Result Value Ref Range   WBC 5.5 4.0 - 10.5 K/uL   RBC 3.51 (L) 3.87 - 5.11 MIL/uL   Hemoglobin 11.1 (L) 12.0 - 15.0 g/dL   HCT 98.134.1 (L) 19.136.0 - 47.846.0 %   MCV 97.2 80.0 - 100.0 fL   MCH 31.6 26.0 - 34.0 pg   MCHC 32.6 30.0 - 36.0 g/dL   RDW 29.513.7 62.111.5 - 30.815.5 %   Platelets 198 150 - 400 K/uL   nRBC 0.0 0.0 - 0.2 %   Neutrophils Relative % 68 %   Neutro  Abs 3.8 1.7 - 7.7 K/uL   Lymphocytes Relative 19 %   Lymphs Abs 1.0 0.7 - 4.0 K/uL   Monocytes Relative 10 %   Monocytes Absolute 0.5 0.1 - 1.0 K/uL   Eosinophils Relative 1 %   Eosinophils Absolute 0.1 0.0 - 0.5 K/uL   Basophils Relative 1 %   Basophils Absolute 0.0 0.0 - 0.1 K/uL   Immature Granulocytes 1 %   Abs Immature Granulocytes 0.03 0.00 - 0.07 K/uL    Comment: Performed at [facility], [address] [place], [place] [address]  Protime-INR     Status: None   Collection Time: 02/25/19 11:19 AM  Result Value Ref Range   Prothrombin Time 12.1 11.4 - 15.2 seconds   INR 0.9 0.8 - 1.2    Comment: (NOTE) INR goal varies based on device and disease states. Performed at [facility], [address] [place], [place] [address]   ABO/Rh     Status: None   Collection Time: 02/25/19 11:19 AM  Result Value Ref Range   ABO/RH(D)      [place] NEG Performed at [facility], [address] [place], [place] [address]   Type and screen [facility]     Status: None   Collection Time: 02/25/19 11:21 AM  Result Value Ref Range   ABO/RH(D) O NEG    Antibody Screen NEG    Sample Expiration      02/28/2019,2359 Performed at [facility], [address] [place], [place] [address]   SARS Coronavirus 2 by RT PCR (hospital order, performed in [facility] hospital lab) Nasopharyngeal Nasopharyngeal Swab     Status: None   Collection Time: 02/25/19  2:20 PM   Specimen: Nasopharyngeal Swab  Result Value Ref Range   SARS Coronavirus 2 NEGATIVE NEGATIVE    Comment: (NOTE) If result is NEGATIVE SARS-CoV-2 target nucleic acids are NOT DETECTED. The SARS-CoV-2 RNA is generally detectable in upper and lower  respiratory specimens during the acute phase of infection. The lowest  concentration of SARS-CoV-2  viral copies this assay can detect is 250  copies / mL. A negative result does not preclude  SARS-CoV-2 infection  and should not be used as the sole basis for treatment or other  patient management decisions.  A negative result may occur with  improper specimen collection / handling, submission of specimen other  than nasopharyngeal swab, presence of viral mutation(s) within the  areas targeted by this assay, and inadequate number of viral copies  (<250 copies / mL). A negative result must be combined with clinical  observations, patient history, and epidemiological information. If result is POSITIVE SARS-CoV-2 target nucleic acids are DETECTED. The SARS-CoV-2 RNA is generally detectable in upper and lower  respiratory specimens dur ing the acute phase of infection.  Positive  results are indicative of active infection with SARS-CoV-2.  Clinical  correlation with patient history and other diagnostic information is  necessary to determine patient infection status.  Positive results do  not rule out bacterial infection or co-infection with other viruses. If result is PRESUMPTIVE POSTIVE SARS-CoV-2 nucleic acids MAY BE PRESENT.   A presumptive positive result was obtained on the submitted specimen  and confirmed on repeat testing.  While 2019 novel coronavirus  (SARS-CoV-2) nucleic acids may be present in the submitted sample  additional confirmatory testing may be necessary for epidemiological  and / or clinical management purposes  to differentiate between  SARS-CoV-2 and other Sarbecovirus currently known to infect humans.  If clinically indicated additional testing with an alternate test  methodology [phone]) is advised. The SARS-CoV-2 RNA is generally  detectable in upper and lower respiratory sp ecimens during the acute  phase of infection. The expected result is Negative. Fact Sheet for Patients:  BoilerBrush.com.cy Fact Sheet for Healthcare Providers: https://pope.com/ This test is not yet approved or cleared by the  [place] FDA and has been authorized for detection and/or diagnosis of SARS-CoV-2 by FDA under an Emergency Use Authorization (EUA).  This EUA will remain in effect (meaning this test can be used) for the duration of the COVID-19 declaration under Section 564(b)(1) of the Act, 21 U.S.C. section 360bbb-3(b)(1), unless the authorization is terminated or revoked sooner. Performed at [facility], [address], [place], [place] [address]     Chemistries  Recent Labs  Lab 02/25/19 1119  NA 138  K 3.7  CL 99  CO2 30  GLUCOSE 94  BUN 16  CREATININE 0.92  CALCIUM 9.4   ------------------------------------------------------------------------------------------------------------------  ------------------------------------------------------------------------------------------------------------------ GFR: CrCl cannot be calculated (Unknown ideal weight.). Liver Function Tests: No results for input(s): AST, ALT, ALKPHOS, BILITOT, PROT, ALBUMIN in the last 168 hours. No results for input(s): LIPASE, AMYLASE in the last 168 hours. No results for input(s): AMMONIA in the last 168 hours. Coagulation Profile: Recent Labs  Lab 02/25/19 1119  INR 0.9    --------------------------------------------------------------------------------------------------------------- Urine analysis: No results found for: COLORURINE, APPEARANCEUR, LABSPEC, PHURINE, GLUCOSEU, HGBUR, BILIRUBINUR, KETONESUR, PROTEINUR, UROBILINOGEN, NITRITE, LEUKOCYTESUR    Imaging Results:    Dg Chest 1 View  Result Date: 02/25/2019 CLINICAL DATA:  Preop exam.  Fall today. EXAM: CHEST  1 VIEW COMPARISON:  06/30/2018. FINDINGS: Mediastinum and hilar structures normal. Heart size normal. COPD. Bilateral pleural-parenchymal thickening again noted consistent scarring. No pleural effusion or pneumothorax. IMPRESSION: COPD. Bilateral pleural-parenchymal thickening noted consistent scarring. Electronically  Signed   By: Maisie Fus  Register   On: 02/25/2019 12:19   Dg Hip Unilat With Pelvis 2-3 Views Right  Result Date: 02/25/2019 CLINICAL DATA:  Right hip  pain and right leg shortening following a fall today. EXAM: DG HIP (WITH OR WITHOUT PELVIS) 2-3V RIGHT COMPARISON:  None. FINDINGS: Right femoral neck fracture with varus angulation and proximal displacement of the distal fragment. Atheromatous arterial calcifications. IMPRESSION: Right femoral neck fracture, as described above. Electronically Signed   By: Claudie Revering M.D.   On: 02/25/2019 12:21    My personal review of EKG: Rhythm NSR   Assessment & Plan:    Active Problems:   Femur fracture, right ([place])   1. Femur fracture-patient has right femoral neck fracture, orthopedic surgery has been consulted.  Plan for total hip arthroplasty today.  Patient will be kept n.p.o.  Hip fracture protocol has been initiated.   DVT Prophylaxis-   Lovenox   AM Labs Ordered, also please review Full Orders  Family Communication: Admission, patients condition and plan of care including tests being ordered have been discussed with the patient  who indicate understanding and agree with the plan and Code Status.  Code Status: Full code  Admission status: Inpatient: Based on patients clinical presentation and evaluation of above clinical data, I have made determination that patient meets Inpatient criteria at this time.  Time spent in minutes : 60 minutes   Oswald Hillock M.D on 02/25/2019 at 4:12 PM

## 2019-02-25 NOTE — Anesthesia Preprocedure Evaluation (Addendum)
Anesthesia Evaluation  Patient identified by MRN, date of birth, ID band Patient awake    Reviewed: Allergy & Precautions, NPO status , Patient's Chart, lab work & pertinent test results  Airway Mallampati: I  TM Distance: >3 FB Neck ROM: Full    Dental  (+) Edentulous Upper, Edentulous Lower   Pulmonary neg pulmonary ROS,    Pulmonary exam normal breath sounds clear to auscultation       Cardiovascular negative cardio ROS Normal cardiovascular exam Rhythm:Regular Rate:Normal  ECG: SR, rate 63   Neuro/Psych negative neurological ROS  negative psych ROS   GI/Hepatic negative GI ROS, Neg liver ROS,   Endo/Other  negative endocrine ROS  Renal/GU negative Renal ROS     Musculoskeletal negative musculoskeletal ROS (+)   Abdominal   Peds  Hematology  (+) anemia ,   Anesthesia Other Findings Right hip fx  Reproductive/Obstetrics                            Anesthesia Physical Anesthesia Plan  ASA: II  Anesthesia Plan: General   Post-op Pain Management:    Induction: Intravenous  PONV Risk Score and Plan: 3 and Ondansetron, Dexamethasone and Treatment may vary due to age or medical condition  Airway Management Planned: LMA  Additional Equipment:   Intra-op Plan:   Post-operative Plan: Extubation in OR  Informed Consent: I have reviewed the patients History and Physical, chart, labs and discussed the procedure including the risks, benefits and alternatives for the proposed anesthesia with the patient or authorized representative who has indicated his/her understanding and acceptance.     Dental advisory given  Plan Discussed with: CRNA  Anesthesia Plan Comments:         Anesthesia Quick Evaluation

## 2019-02-25 NOTE — ED Provider Notes (Signed)
[facility]-EMERGENCY DEPT Provider Note   CSN: [id number] Arrival date & time: 02/25/19  1051     History   Chief Complaint Chief Complaint  Patient presents with  . Fall  . Hip Pain    right    HPI Madison Young is a 78 y.o. female.     HPI  78 year old female previously healthy presents today complaining of right hip pain after fall this morning.  She states that she was out walking as she normally does when she had a mechanical fall.  She is not sure about her symptoms, but denies any loss of consciousness or other injuries.  She is not on any blood thinners.  She points to her right hip when asked where the pain is.  Pain is 5 out of 10.  Is worse with movement and standing.  She called 911 and was transferred here to the emergency department.  She denies any exposure to Covid, cough, chest pain, or fever  History reviewed. No pertinent past medical history.  There are no active problems to display for this patient.   History reviewed. No pertinent surgical history.   OB History   No obstetric history on file.      Home Medications    Prior to Admission medications   Not on File    Family History Family History  Family history unknown: Yes    Social History Social History   Tobacco Use  . Smoking status: Never Smoker  Substance Use Topics  . Alcohol use: Yes  . Drug use: No     Allergies   Patient has no known allergies.   Review of Systems Review of Systems  All other systems reviewed and are negative.    Physical Exam Updated Vital Signs BP (!) 136/97 (BP Location: Left Arm)   Pulse 68   Temp 98 F (36.7 C) (Oral)   Resp 17   SpO2 97%   Physical Exam Vitals signs and nursing note reviewed.  Constitutional:      Appearance: Normal appearance.  HENT:     Head: Normocephalic.     Right Ear: External ear normal.     Left Ear: External ear normal.     Nose: Nose normal.     Mouth/Throat:     Mouth: Mucous  membranes are moist.  Eyes:     Extraocular Movements: Extraocular movements intact.     Pupils: Pupils are equal, round, and reactive to light.  Neck:     Musculoskeletal: Normal range of motion and neck supple.  Cardiovascular:     Rate and Rhythm: Normal rate and regular rhythm.     Pulses: Normal pulses.  Pulmonary:     Effort: Pulmonary effort is normal.     Breath sounds: Normal breath sounds.  Abdominal:     General: Abdomen is flat.     Palpations: Abdomen is soft.  Musculoskeletal:     Comments: Patient states that she cannot move the right hip causes pain There is no definite tenderness on palpation of the right hip, right femur, or pelvis. Attempted flexion of the right hip and she has some pain with this.  There is no other obvious signs of trauma on the head pelvis, or lower extremities. There is an abrasion to the right elbow but she has no tenderness and has full active range of motion of the elbow and shoulder.  The entire spine is palpated and no point tenderness is noted  Skin:  General: Skin is warm and dry.     Capillary Refill: Capillary refill takes less than 2 seconds.  Neurological:     General: No focal deficit present.     Mental Status: She is oriented to person, place, and time.  Psychiatric:        Mood and Affect: Mood normal.      ED Treatments / Results  Labs (all labs ordered are listed, but only abnormal results are displayed) Labs Reviewed  BASIC METABOLIC PANEL  CBC WITH DIFFERENTIAL/PLATELET  PROTIME-INR  TYPE AND SCREEN    EKG EKG Interpretation  Date/Time:  Friday February 25 2019 11:18:30 EDT Ventricular Rate:  63 PR Interval:    QRS Duration: 100 QT Interval:  437 QTC Calculation: 448 R Axis:   61 Text Interpretation: Sinus rhythm RSR' in V1 or V2, probably normal variant Confirmed by Pattricia Boss [phone]) on 02/25/2019 1:36:32 PM   Radiology Dg Chest 1 View  Result Date: 02/25/2019 CLINICAL DATA:  Preop exam.  Fall  today. EXAM: CHEST  1 VIEW COMPARISON:  06/30/2018. FINDINGS: Mediastinum and hilar structures normal. Heart size normal. COPD. Bilateral pleural-parenchymal thickening again noted consistent scarring. No pleural effusion or pneumothorax. IMPRESSION: COPD. Bilateral pleural-parenchymal thickening noted consistent scarring. Electronically Signed   By: Marcello Moores  Register   On: 02/25/2019 12:19   Dg Hip Unilat With Pelvis 2-3 Views Right  Result Date: 02/25/2019 CLINICAL DATA:  Right hip pain and right leg shortening following a fall today. EXAM: DG HIP (WITH OR WITHOUT PELVIS) 2-3V RIGHT COMPARISON:  None. FINDINGS: Right femoral neck fracture with varus angulation and proximal displacement of the distal fragment. Atheromatous arterial calcifications. IMPRESSION: Right femoral neck fracture, as described above. Electronically Signed   By: Claudie Revering M.D.   On: 02/25/2019 12:21    Procedures Procedures (including critical care time)  Medications Ordered in ED Medications - No data to display   Initial Impression / Assessment and Plan / ED Course  I have reviewed the triage vital signs and the nursing notes.  Pertinent labs & imaging results that were available during my care of the patient were reviewed by me and considered in my medical decision making (see chart for details).        78 yo female previously healthy presents today with fall and pain in her right hip.  Radiographic studies revealed right femoral neck fracture.  Patient has remained n.p.o.  Dr. Ninfa Linden is consulted.  Hospitalist have been consulted for admission Discussed with Dr. Darrick Meigs and will see for admission Final Clinical Impressions(s) / ED Diagnoses   Final diagnoses:  Fall, initial encounter  Closed fracture of right hip, initial encounter [facility])    ED Discharge Orders    None       Pattricia Boss, MD 02/25/19 1340

## 2019-02-25 NOTE — Anesthesia Procedure Notes (Signed)
Procedure Name: Intubation Performed by: Gean Maidens, CRNA Pre-anesthesia Checklist: Patient identified, Emergency Drugs available, Suction available, Patient being monitored and Timeout performed Oxygen Delivery Method: Circle system utilized Preoxygenation: Pre-oxygenation with 100% oxygen Induction Type: IV induction Ventilation: Mask ventilation without difficulty Laryngoscope Size: Mac and 4 Grade View: Grade I Tube type: Oral Tube size: 7.0 mm Number of attempts: 1 Airway Equipment and Method: Stylet Placement Confirmation: ETT inserted through vocal cords under direct vision,  positive ETCO2 and breath sounds checked- equal and bilateral Secured at: 21 cm Tube secured with: Tape Dental Injury: Teeth and Oropharynx as per pre-operative assessment

## 2019-02-26 DIAGNOSIS — D62 Acute posthemorrhagic anemia: Secondary | ICD-10-CM

## 2019-02-26 LAB — BASIC METABOLIC PANEL
Anion gap: 10 (ref 5–15)
BUN: 16 mg/dL (ref 8–23)
CO2: 25 mmol/L (ref 22–32)
Calcium: 8.4 mg/dL — ABNORMAL LOW (ref 8.9–10.3)
Chloride: 100 mmol/L (ref 98–111)
Creatinine, Ser: 0.94 mg/dL (ref 0.44–1.00)
GFR calc Af Amer: >60 mL/min (ref >60)
GFR calc non Af Amer: 58 mL/min — ABNORMAL LOW (ref >60)
Glucose, Bld: 168 mg/dL — ABNORMAL HIGH (ref 70–99)
Potassium: 4.2 mmol/L (ref 3.5–5.1)
Sodium: 135 mmol/L (ref 135–145)

## 2019-02-26 LAB — CBC
HCT: 29.8 % — ABNORMAL LOW (ref 36.0–46.0)
Hemoglobin: 9.6 g/dL — ABNORMAL LOW (ref 12.0–15.0)
MCH: 31.3 pg (ref 26.0–34.0)
MCHC: 32.2 g/dL (ref 30.0–36.0)
MCV: 97.1 fL (ref 80.0–100.0)
Platelets: 171 10*3/uL (ref 150–400)
RBC: 3.07 MIL/uL — ABNORMAL LOW (ref 3.87–5.11)
RDW: 13.9 % (ref 11.5–15.5)
WBC: 9.4 10*3/uL (ref 4.0–10.5)
nRBC: 0 % (ref 0.0–0.2)

## 2019-02-26 MED ORDER — HYDROCODONE-ACETAMINOPHEN 5-325 MG PO TABS: 1.0000 | ORAL_TABLET | Freq: Four times a day (QID) | ORAL | 0 refills | Status: AC | PRN

## 2019-02-26 MED ORDER — ASPIRIN 81 MG PO CHEW: 81.0000 mg | CHEWABLE_TABLET | Freq: Two times a day (BID) | ORAL | 0 refills | Status: AC

## 2019-02-26 NOTE — Plan of Care (Signed)
?  Problem: Clinical Measurements: ?Goal: Respiratory complications will improve ?Outcome: Progressing ?  ?Problem: Clinical Measurements: ?Goal: Cardiovascular complication will be avoided ?Outcome: Progressing ?  ?Problem: Nutrition: ?Goal: Adequate nutrition will be maintained ?Outcome: Progressing ?  ?Problem: Coping: ?Goal: Level of anxiety will decrease ?Outcome: Progressing ?  ?

## 2019-02-26 NOTE — Progress Notes (Signed)
  PROGRESS NOTE  Madison Young OAC:[id number] DOB: 1940/05/31 DOA: 02/25/2019 PCP: Patient, No Pcp Per  Brief History   78 year old woman presented to the emergency department after a fall at home resulting in right hip pain she was unable to get up.  Right femoral neck fracture.  A & P  Right femoral neck fracture status post fall at home.  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 EKG nonacute --Doing well postoperatively.  Up with physical therapy. --DVT prophylaxis per orthopedics  ABLA secondary to surgery --check CBC in AM  No significant PMH   DVT prophylaxis: ASA BID Code Status: Full Family Communication: none Disposition Plan: pending PT evaluation    Murray Hodgkins, MD  [facility] Direct contact: see [email or web address] (further directions at bottom of note if needed) 7PM-7AM contact night coverage as at bottom of note 02/26/2019, 11:25 AM  LOS: 1 day   Significant Hospital Events   . 10/30 admitted for right hip fracture status post fall   Consults:  . Orthopedics   Procedures:  . 10/30 total hip arthroplasty, right  Significant Diagnostic Tests:  . SARS Covid negative .    Micro Data:  .    Antimicrobials:  .   Interval History/Subjective  Feels well, no significant pain.  Tolerating diet.  Objective   Vitals:  Vitals:   02/26/19 0548 02/26/19 0947  BP: 122/60 123/66  Pulse: 75 79  Resp: 16 16  Temp: 98.4 F (36.9 C) 98.7 F (37.1 C)  SpO2: 95% 94%    Exam:  Constitutional:  . Appears calm and comfortable Respiratory:  . CTA bilaterally, no w/r/r.  . Respiratory effort normal.  Cardiovascular:  . RRR, no m/r/g . No LE extremity edema   Psychiatric:  . Mental status o Mood, affect appropriate  I have personally reviewed the following:   Today's Data  . CBC noted, Hgb down to 9.6 . BMP unremarkable  Scheduled Meds: . aspirin  81 mg Oral BID  . docusate sodium  100 mg Oral BID  . pantoprazole  40 mg Oral Daily  .  traMADol  50 mg Oral Q6H   Continuous Infusions: . sodium chloride 75 mL/hr at 02/25/19 2235  . methocarbamol (ROBAXIN) IV 500 mg (02/25/19 1929)    Principal Problem:   Closed fracture of right hip ([place]) Active Problems:   Femur fracture, right ([facility])   Acute blood loss anemia   LOS: 1 day   How to contact the [facility] Attending or Consulting provider [place] or covering provider during after hours [place], for this patient?  1. Check the care team in [facility] and look for a) attending/consulting TR[facility] provider listed and b) the [facility] team listed 2. Log into [email or web address] and use [place]'s universal password to access. If you do not have the password, please contact the hospital operator. 3. Locate the [facility] provider you are looking for under [facility] and page to a number that you can be directly reached. 4. If you still have difficulty reaching the provider, please page the [facility] (Director on Call) for the Hospitalists listed on amion for assistance.

## 2019-02-26 NOTE — Progress Notes (Signed)
Subjective: 1 Day Post-Op Procedure(s) (LRB): TOTAL HIP ARTHROPLASTY ANTERIOR APPROACH (Right) Patient reports pain as moderate.  Has already been up with therapy.  Hopes to go home tomorrow.  Objective: Vital signs in last 24 hours: Temp:  [97.5 F (36.4 C)-98.7 F (37.1 C)] 98.7 F (37.1 C) (10/31 0947) Pulse Rate:  [69-95] 79 (10/31 0947) Resp:  [12-20] 16 (10/31 0947) BP: (122-190)/(60-113) 123/66 (10/31 0947) SpO2:  [93 %-100 %] 94 % (10/31 0947) Weight:  [42.6 kg] 42.6 kg (10/30 1623)  Intake/Output from previous day: 10/30 0701 - 10/31 0700 In: 2835.2 [P.O.:360; I.V.:1775.2; IV Piggyback:400] Out: 1130 [Urine:980; Blood:150] Intake/Output this shift: Total I/O In: 600 [P.O.:600] Out: -   Recent Labs    02/25/19 1119 02/26/19 0251  HGB 11.1* 9.6*   Recent Labs    02/25/19 1119 02/26/19 0251  WBC 5.5 9.4  RBC 3.51* 3.07*  HCT 34.1* 29.8*  PLT 198 171   Recent Labs    02/25/19 1119 02/26/19 0251  NA 138 135  K 3.7 4.2  CL 99 100  CO2 30 25  BUN 16 16  CREATININE 0.92 0.94  GLUCOSE 94 168*  CALCIUM 9.4 8.4*   Recent Labs    02/25/19 1119  INR 0.9    Sensation intact distally Intact pulses distally Dorsiflexion/Plantar flexion intact Incision: dressing C/D/I   Assessment/Plan: 1 Day Post-Op Procedure(s) (LRB): TOTAL HIP ARTHROPLASTY ANTERIOR APPROACH (Right) Up with therapy Plan for discharge tomorrow Discharge home with home health   Discharge tomorrow if doing well medically, has cleared therapy, and has good support at home.      Mcarthur Rossetti 02/26/2019, 12:09 PM

## 2019-02-26 NOTE — Plan of Care (Signed)
  Problem: Coping: Goal: Level of anxiety will decrease Outcome: Progressing   Problem: Elimination: Goal: Will not experience complications related to bowel motility Outcome: Progressing   Problem: Elimination: Goal: Will not experience complications related to urinary retention Outcome: Progressing   Problem: Pain Managment: Goal: General experience of comfort will improve Outcome: Progressing   Problem: Safety: Goal: Ability to remain free from injury will improve Outcome: Progressing   

## 2019-02-26 NOTE — Evaluation (Signed)
Physical Therapy Evaluation Patient Details Name: Madison Young MRN: [id number] DOB: 02/08/1941 Today's Date: 02/26/2019   History of Present Illness  Pt s/p fall wtih R hip fx and now s/o R THR by anterior approach  Clinical Impression  Pt s/p R THR and presents with decreased R LE strength/ROM and post op pain limiting functional mobility.  Pt should progress to dc home with family assist.    Follow Up Recommendations Home health PT    Equipment Recommendations  Rolling walker with 5" wheels    Recommendations for Other Services OT consult     Precautions / Restrictions Precautions Precautions: Fall Restrictions Weight Bearing Restrictions: No Other Position/Activity Restrictions: WBAT      Mobility  Bed Mobility Overal bed mobility: Needs Assistance Bed Mobility: Supine to Sit     Supine to sit: Min assist     General bed mobility comments: increased time with cues for sequence and use of L LE to self assist  Transfers Overall transfer level: Needs assistance Equipment used: Rolling walker (2 wheeled) Transfers: Sit to/from Stand Sit to Stand: Min assist;+2 physical assistance;+2 safety/equipment         General transfer comment: cues for LE management and use of UEs to self assist  Ambulation/Gait Ambulation/Gait assistance: Min assist Gait Distance (Feet): 58 Feet Assistive device: Rolling walker (2 wheeled) Gait Pattern/deviations: Step-to pattern;Step-through pattern;Decreased step length - right;Decreased step length - left;Shuffle;Trunk flexed Gait velocity: decr   General Gait Details: cues for safety, sequence, posture and position from RW; physical assist for balance and RW management  Stairs            Wheelchair Mobility    Modified Rankin (Stroke Patients Only)       Balance Overall balance assessment: Needs assistance Sitting-balance support: No upper extremity supported;Feet supported Sitting balance-Leahy Scale: Fair      Standing balance support: Bilateral upper extremity supported Standing balance-Leahy Scale: Poor                               Pertinent Vitals/Pain Pain Assessment: Faces Faces Pain Scale: Hurts a little bit Pain Location: R hip Pain Descriptors / Indicators: Sore Pain Intervention(s): Limited activity within patient's tolerance;Monitored during session;Premedicated before session    Home Living Family/patient expects to be discharged to:: Private residence Living Arrangements: Children Available Help at Discharge: Family Type of Home: House Home Access: Level entry     Home Layout: One level Home Equipment: None      Prior Function Level of Independence: Independent               Hand Dominance        Extremity/Trunk Assessment   Upper Extremity Assessment Upper Extremity Assessment: Overall WFL for tasks assessed    Lower Extremity Assessment Lower Extremity Assessment: RLE deficits/detail    Cervical / Trunk Assessment Cervical / Trunk Assessment: Normal  Communication   Communication: No difficulties  Cognition Arousal/Alertness: Awake/alert Behavior During Therapy: Impulsive;WFL for tasks assessed/performed Overall Cognitive Status: Within Functional Limits for tasks assessed                                        General Comments      Exercises     Assessment/Plan    PT Assessment Patient needs continued PT services  PT Problem List Decreased strength;Decreased  range of motion;Decreased activity tolerance;Decreased balance;Decreased mobility;Decreased knowledge of use of DME;Pain       PT Treatment Interventions DME instruction;Gait training;Stair training;Functional mobility training;Therapeutic activities;Therapeutic exercise;Patient/family education    PT Goals (Current goals can be found in the Care Plan section)  Acute Rehab PT Goals Patient Stated Goal: Regain IND PT Goal Formulation: With  patient Time For Goal Achievement: 03/05/19 Potential to Achieve Goals: Good    Frequency 7X/week   Barriers to discharge        Co-evaluation PT/OT/SLP Co-Evaluation/Treatment: Yes Reason for Co-Treatment: For patient/therapist safety PT goals addressed during session: Mobility/safety with mobility OT goals addressed during session: ADL's and self-care       AM-PAC PT "6 Clicks" Mobility  Outcome Measure Help needed turning from your back to your side while in a flat bed without using bedrails?: A Little Help needed moving from lying on your back to sitting on the side of a flat bed without using bedrails?: A Little Help needed moving to and from a bed to a chair (including a wheelchair)?: A Lot Help needed standing up from a chair using your arms (e.g., wheelchair or bedside chair)?: A Lot Help needed to walk in hospital room?: A Little Help needed climbing 3-5 steps with a railing? : A Lot 6 Click Score: 15    End of Session Equipment Utilized During Treatment: Gait belt Activity Tolerance: Patient tolerated treatment well Patient left: in chair;with call bell/phone within reach;with chair alarm set Nurse Communication: Mobility status PT Visit Diagnosis: Difficulty in walking, not elsewhere classified (R26.2)    Time: 3810-1751 PT Time Calculation (min) (ACUTE ONLY): 24 min   Charges:   PT Evaluation $PT Eval Low Complexity: 1 Low          [place] Pager [phone] Office [phone]   Cris Gibby 02/26/2019, 12:35 PM

## 2019-02-26 NOTE — Progress Notes (Signed)
Physical Therapy Treatment Patient Details Name: Madison Young MRN: [id number] DOB: 10-Oct-1940 Today's Date: 02/26/2019    History of Present Illness Pt s/p fall with R hip fx and now s/p R THR by anterior approach    PT Comments    Pt just back to bed with nursing but agreeable to work on therex program.   Follow Up Recommendations  Home health PT     Equipment Recommendations  Rolling walker with 5" wheels    Recommendations for Other Services OT consult     Precautions / Restrictions Precautions Precautions: Fall Restrictions Weight Bearing Restrictions: No Other Position/Activity Restrictions: WBAT    Mobility  Bed Mobility Overal bed mobility: Needs Assistance Bed Mobility: Supine to Sit     Supine to sit: Min assist     General bed mobility comments: increased time with cues for sequence and use of L LE to self assist  Transfers Overall transfer level: Needs assistance Equipment used: Rolling walker (2 wheeled) Transfers: Sit to/from Stand Sit to Stand: Min assist;+2 physical assistance;+2 safety/equipment         General transfer comment: cues for LE management and use of UEs to self assist  Ambulation/Gait                 Stairs             Wheelchair Mobility    Modified Rankin (Stroke Patients Only)       Balance Overall balance assessment: Needs assistance;History of Falls Sitting-balance support: No upper extremity supported;Feet supported Sitting balance-Leahy Scale: Fair     Standing balance support: Bilateral upper extremity supported Standing balance-Leahy Scale: Poor                              Cognition Arousal/Alertness: Awake/alert Behavior During Therapy: Impulsive;WFL for tasks assessed/performed Overall Cognitive Status: Within Functional Limits for tasks assessed                                        Exercises Total Joint Exercises Ankle Circles/Pumps: AROM;Both;20  reps;Supine Quad Sets: AROM;Both;10 reps;Supine Heel Slides: AAROM;Right;20 reps;10 reps;Supine Hip ABduction/ADduction: AAROM;Right;20 reps;Supine    General Comments        Pertinent Vitals/Pain Pain Assessment: 0-10 Pain Score: 3  Faces Pain Scale: Hurts a little bit Pain Location: R hip Pain Descriptors / Indicators: Sore Pain Intervention(s): Limited activity within patient's tolerance;Monitored during session;Premedicated before session;Ice applied    Home Living Family/patient expects to be discharged to:: Private residence Living Arrangements: Children Available Help at Discharge: Family Type of Home: House Home Access: Level entry   Home Layout: One level Home Equipment: None      Prior Function Level of Independence: Independent          PT Goals (current goals can now be found in the care plan section) Acute Rehab PT Goals Patient Stated Goal: Regain IND PT Goal Formulation: With patient Time For Goal Achievement: 03/05/19 Potential to Achieve Goals: Good Progress towards PT goals: Progressing toward goals    Frequency    7X/week      PT Plan Current plan remains appropriate    Co-evaluation   Reason for Co-Treatment: For patient/therapist safety   OT goals addressed during session: ADL's and self-care      AM-PAC PT "6 Clicks" Mobility   Outcome Measure  Help needed turning from your back to your side while in a flat bed without using bedrails?: A Little Help needed moving from lying on your back to sitting on the side of a flat bed without using bedrails?: A Little Help needed moving to and from a bed to a chair (including a wheelchair)?: A Lot Help needed standing up from a chair using your arms (e.g., wheelchair or bedside chair)?: A Lot Help needed to walk in hospital room?: A Little Help needed climbing 3-5 steps with a railing? : A Lot 6 Click Score: 15    End of Session Equipment Utilized During Treatment: Gait belt Activity  Tolerance: Patient tolerated treatment well Patient left: in bed;with call bell/phone within reach;with family/visitor present Nurse Communication: Mobility status PT Visit Diagnosis: Difficulty in walking, not elsewhere classified (R26.2)     Time: 9211-9417 PT Time Calculation (min) (ACUTE ONLY): 22 min  Charges:  $Therapeutic Exercise: 8-22 mins                     Mauro Kaufmann PT Acute Rehabilitation Services Pager [phone] Office [phone]    Madison Young 02/26/2019, 4:50 PM

## 2019-02-26 NOTE — Anesthesia Postprocedure Evaluation (Signed)
Anesthesia Post Note  Patient: [name]  Procedure(s) Performed: TOTAL HIP ARTHROPLASTY ANTERIOR APPROACH (Right Hip)     Patient location during evaluation: PACU Anesthesia Type: General Level of consciousness: awake Pain management: pain level controlled Vital Signs Assessment: post-procedure vital signs reviewed and stable Respiratory status: spontaneous breathing, nonlabored ventilation, respiratory function stable and patient connected to nasal cannula oxygen Cardiovascular status: blood pressure returned to baseline and stable Postop Assessment: no apparent nausea or vomiting Anesthetic complications: no    Last Vitals:  Vitals:   02/26/19 0200 02/26/19 0548  BP: 128/67 122/60  Pulse: 72 75  Resp: 16 16  Temp: 36.8 C 36.9 C  SpO2: 100% 95%    Last Pain:  Vitals:   02/26/19 0600  TempSrc:   PainSc: 2                  Ryan P Ellender

## 2019-02-26 NOTE — Evaluation (Signed)
Occupational Therapy Evaluation Patient Details Name: Madison Young MRN: [id number] DOB: [birth year] Today's Date: 02/26/2019    History of Present Illness Pt s/p fall wtih R hip fx and now s/o R THR by anterior approach   Clinical Impression   This 78 y/o female presents with the above. PTA pt reports independence with ADL and functional mobility. Pt currently requiring minA for functional mobility using RW, minguard assist for seated UB ADL and up to maxA for LB ADL. Pt mostly limited due to pain, impaired balance, and generalized weakness at this time. Pt lives with son but reports daughter will also be available to assist after return home. Pt will benefit from continued acute OT services and recommend follow up [facility] services after discharge to maximize her safety and independence with ADL and mobility. Will follow.     Follow Up Recommendations  Home health OT;Supervision/Assistance - 24 hour    Equipment Recommendations  3 in 1 bedside commode(for use in shower)           Precautions / Restrictions Precautions Precautions: Fall Restrictions Weight Bearing Restrictions: No Other Position/Activity Restrictions: WBAT      Mobility Bed Mobility Overal bed mobility: Needs Assistance Bed Mobility: Supine to Sit     Supine to sit: Min assist     General bed mobility comments: increased time with cues for sequence and use of L LE to self assist  Transfers Overall transfer level: Needs assistance Equipment used: Rolling walker (2 wheeled) Transfers: Sit to/from Stand Sit to Stand: Min assist;+2 physical assistance;+2 safety/equipment         General transfer comment: cues for LE management and use of UEs to self assist    Balance Overall balance assessment: Needs assistance;History of Falls Sitting-balance support: No upper extremity supported;Feet supported Sitting balance-Leahy Scale: Fair     Standing balance support: Bilateral upper extremity  supported Standing balance-Leahy Scale: Poor                             ADL either performed or assessed with clinical judgement   ADL Overall ADL's : Needs assistance/impaired Eating/Feeding: Independent   Grooming: Set up;Sitting   Upper Body Bathing: Min guard;Sitting   Lower Body Bathing: Moderate assistance;Sit to/from stand   Upper Body Dressing : Set up;Min guard;Sitting   Lower Body Dressing: Maximal assistance;Sit to/from stand   Toilet Transfer: Minimal assistance;+2 for safety/equipment;Ambulation;RW Toilet Transfer Details (indicate cue type and reason): simulated via transfer to recliner, room/hallway level mobility Toileting- Clothing Manipulation and Hygiene: Moderate assistance;Sit to/from stand       Functional mobility during ADLs: Minimal assistance;+2 for safety/equipment;Rolling walker General ADL Comments: pt limited due to pain, decreased standing balance                         Pertinent Vitals/Pain Pain Assessment: Faces Faces Pain Scale: Hurts a little bit Pain Location: R hip Pain Descriptors / Indicators: Sore Pain Intervention(s): Limited activity within patient's tolerance;Monitored during session;Repositioned     Hand Dominance     Extremity/Trunk Assessment Upper Extremity Assessment Upper Extremity Assessment: Overall WFL for tasks assessed   Lower Extremity Assessment Lower Extremity Assessment: Defer to PT evaluation   Cervical / Trunk Assessment Cervical / Trunk Assessment: Normal   Communication Communication Communication: No difficulties   Cognition Arousal/Alertness: Awake/alert Behavior During Therapy: Impulsive;WFL for tasks assessed/performed Overall Cognitive Status: Within Functional Limits for tasks assessed  General Comments       Exercises     Shoulder Instructions      Home Living Family/patient expects to be discharged to:: Private  residence Living Arrangements: Children Available Help at Discharge: Family Type of Home: House Home Access: Level entry     Home Layout: One level     Bathroom Shower/Tub: Occupational psychologist: [place]: None          Prior Functioning/Environment Level of Independence: Independent                 OT Problem List: Decreased strength;Decreased range of motion;Decreased activity tolerance;Impaired balance (sitting and/or standing);Decreased safety awareness;Decreased knowledge of use of DME or AE;Decreased knowledge of precautions;Pain      OT Treatment/Interventions: Self-care/ADL training;Therapeutic exercise;Energy conservation;DME and/or AE instruction;Therapeutic activities;Patient/family education;Balance training    OT Goals(Current goals can be found in the care plan section) Acute Rehab OT Goals Patient Stated Goal: Regain IND OT Goal Formulation: With patient Time For Goal Achievement: 03/12/19 Potential to Achieve Goals: Good  OT Frequency: Min 2X/week   Barriers to D/C:            Co-evaluation PT/OT/SLP Co-Evaluation/Treatment: Yes Reason for Co-Treatment: For patient/therapist safety PT goals addressed during session: Mobility/safety with mobility OT goals addressed during session: ADL's and self-care      AM-PAC OT "6 Clicks" Daily Activity     Outcome Measure Help from another person eating meals?: None Help from another person taking care of personal grooming?: A Little Help from another person toileting, which includes using toliet, bedpan, or urinal?: A Lot Help from another person bathing (including washing, rinsing, drying)?: A Lot Help from another person to put on and taking off regular upper body clothing?: None Help from another person to put on and taking off regular lower body clothing?: A Lot 6 Click Score: 17   End of Session Equipment Utilized During Treatment: Gait belt;Rolling walker Nurse  Communication: Mobility status  Activity Tolerance: Patient tolerated treatment well Patient left: in chair;with call bell/phone within reach;with chair alarm set  OT Visit Diagnosis: Unsteadiness on feet (R26.81);Other abnormalities of gait and mobility (R26.89)                Time: 5053-9767 OT Time Calculation (min): 23 min Charges:  OT General Charges $OT Visit: 1 Visit OT Evaluation $OT Eval Moderate Complexity: 1 Mod  Lou Cal, OT [employer] Pager [phone] Office [phone]   Raymondo Band 02/26/2019, 1:23 PM

## 2019-02-27 DIAGNOSIS — S7291XA Unspecified fracture of right femur, initial encounter for closed fracture: Secondary | ICD-10-CM

## 2019-02-27 LAB — CBC
HCT: 26.7 % — ABNORMAL LOW (ref 36.0–46.0)
Hemoglobin: 8.5 g/dL — ABNORMAL LOW (ref 12.0–15.0)
MCH: 30.8 pg (ref 26.0–34.0)
MCHC: 31.8 g/dL (ref 30.0–36.0)
MCV: 96.7 fL (ref 80.0–100.0)
Platelets: 144 10*3/uL — ABNORMAL LOW (ref 150–400)
RBC: 2.76 MIL/uL — ABNORMAL LOW (ref 3.87–5.11)
RDW: 14.1 % (ref 11.5–15.5)
WBC: 7.7 10*3/uL (ref 4.0–10.5)
nRBC: 0 % (ref 0.0–0.2)

## 2019-02-27 MED ORDER — ACETAMINOPHEN 325 MG PO TABS: 325.0000 mg | ORAL_TABLET | Freq: Four times a day (QID) | ORAL | 0 refills | Status: AC | PRN

## 2019-02-27 NOTE — Progress Notes (Signed)
Occupational Therapy Treatment Patient Details Name: Madison Young MRN: [id number] DOB: 24-Jul-1940 Today's Date: 02/27/2019    History of present illness Pt s/p fall with R hip fx and now s/p R THR by anterior approach   OT comments  Patient is very impulsive and she requires cues for safety and proper hand placement for sit ti stand and stand to sit. Patient is not able to don socks and attempted to ed her on use of AE for LE adls but she refused. She stated she has people at home who can assist. Pt. Was ed on increasing safety with transfer to commose with use of BSC. Pt. To be followed until d/c home.  Follow Up Recommendations       Equipment Recommendations       Recommendations for Other Services      Precautions / Restrictions Precautions Precautions: Fall Restrictions Weight Bearing Restrictions: No Other Position/Activity Restrictions: WBAT       Mobility Bed Mobility                  Transfers       Sit to Stand: Min assist         General transfer comment: patient needs cues for proper hanc placement and safety. she is very impulsive.    Balance                                           ADL either performed or assessed with clinical judgement   ADL       Grooming: Wash/dry hands;Supervision/safety;Standing               Lower Body Dressing: Maximal assistance(patient refusing ed on use of ae for LE adls. )   Toilet Transfer: Minimal assistance;Cueing for safety;Ambulation           Functional mobility during ADLs: Minimal assistance General ADL Comments: patient refusing ed on use of AE to increase I with LE adls. Patient states she has people to assist.      Vision       Perception     Praxis      Cognition Arousal/Alertness: Awake/alert Behavior During Therapy: Impulsive;WFL for tasks assessed/performed Overall Cognitive Status: Within Functional Limits for tasks assessed                                          Exercises     Shoulder Instructions       General Comments      Pertinent Vitals/ Pain       Pain Assessment: 0-10 Pain Score: 1  Pain Location: R hip Pain Descriptors / Indicators: Throbbing Pain Intervention(s): Limited activity within patient's tolerance  Home Living                                          Prior Functioning/Environment              Frequency           Progress Toward Goals  OT Goals(current goals can now be found in the care plan section)  Progress towards OT goals: Progressing toward goals  Acute Rehab OT Goals Patient Stated Goal:  go home  Plan Discharge plan remains appropriate    Co-evaluation                 AM-PAC OT "6 Clicks" Daily Activity     Outcome Measure   Help from another person eating meals?: None Help from another person taking care of personal grooming?: A Little Help from another person toileting, which includes using toliet, bedpan, or urinal?: A Little Help from another person bathing (including washing, rinsing, drying)?: A Lot Help from another person to put on and taking off regular upper body clothing?: None Help from another person to put on and taking off regular lower body clothing?: A Lot 6 Click Score: 18    End of Session Equipment Utilized During Treatment: Gait belt;Rolling walker      Activity Tolerance Patient tolerated treatment well   Patient Left in chair;with call bell/phone within reach;with chair alarm set   Nurse Communication (ok therapy)        Time:  -     Charges:    6 clicks  Madison Young 02/27/2019, 10:25 AM

## 2019-02-27 NOTE — TOC Progression Note (Signed)
Transition of Care [facility]) - Progression Note    Patient Details  Name: Madison Young MRN: [id number] Date of Birth: 01/19/1941  [facility]) CM/SW Contact  Joaquin Courts, RN Phone Number: 02/27/2019, 12:39 PM  Clinical Narrative:    CM spoke with patient. [facility] up with [facility] for HHPT/OT. [facility] to deliver rolling walker and 3-in-1 to bedside for home use.    Expected Discharge Plan: [place] Barriers to Discharge: No Barriers Identified  Expected Discharge Plan and Services Expected Discharge Plan: [place]   Discharge Planning Services: CM Consult Post Acute Care Choice: [place] arrangements for the past 2 months: Single Family Home Expected Discharge Date: 02/27/19               DME Arranged: Berta Minor rolling DME Agency: [employer] Date DME Agency Contacted: 02/27/19 Time DME Agency Contacted: 1238 Representative spoke with at DME Agency: [place] HH Arranged: OT, PT [place] Agency: [place] ([place]) Date [place]: 02/27/19 Time [place]: 1238 Representative spoke with at [place]: [place] ([place]) Interventions    Readmission Risk Interventions No flowsheet data found.

## 2019-02-27 NOTE — Discharge Instructions (Signed)
INSTRUCTIONS AFTER JOINT REPLACEMENT  ° °o Remove items at home which could result in a fall. This includes throw rugs or furniture in walking pathways °o ICE to the affected joint every three hours while awake for 30 minutes at a time, for at least the first 3-5 days, and then as needed for pain and swelling.  Continue to use ice for pain and swelling. You may notice swelling that will progress down to the foot and ankle.  This is normal after surgery.  Elevate your leg when you are not up walking on it.   °o Continue to use the breathing machine you got in the hospital (incentive spirometer) which will help keep your temperature down.  It is common for your temperature to cycle up and down following surgery, especially at night when you are not up moving around and exerting yourself.  The breathing machine keeps your lungs expanded and your temperature down. ° ° °DIET:  As you were doing prior to hospitalization, we recommend a well-balanced diet. ° °DRESSING / WOUND CARE / SHOWERING ° °Keep the surgical dressing until follow up.  The dressing is water proof, so you can shower without any extra covering.  IF THE DRESSING FALLS OFF or the wound gets wet inside, change the dressing with sterile gauze.  Please use good hand washing techniques before changing the dressing.  Do not use any lotions or creams on the incision until instructed by your surgeon.   ° °ACTIVITY ° °o Increase activity slowly as tolerated, but follow the weight bearing instructions below.   °o No driving for 6 weeks or until further direction given by your physician.  You cannot drive while taking narcotics.  °o No lifting or carrying greater than 10 lbs. until further directed by your surgeon. °o Avoid periods of inactivity such as sitting longer than an hour when not asleep. This helps prevent blood clots.  °o You may return to work once you are authorized by your doctor.  ° ° ° °WEIGHT BEARING  ° °Weight bearing as tolerated with assist  device (walker, cane, etc) as directed, use it as long as suggested by your surgeon or therapist, typically at least 4-6 weeks. ° ° °EXERCISES ° °Results after joint replacement surgery are often greatly improved when you follow the exercise, range of motion and muscle strengthening exercises prescribed by your doctor. Safety measures are also important to protect the joint from further injury. Any time any of these exercises cause you to have increased pain or swelling, decrease what you are doing until you are comfortable again and then slowly increase them. If you have problems or questions, call your caregiver or physical therapist for advice.  ° °Rehabilitation is important following a joint replacement. After just a few days of immobilization, the muscles of the leg can become weakened and shrink (atrophy).  These exercises are designed to build up the tone and strength of the thigh and leg muscles and to improve motion. Often times heat used for twenty to thirty minutes before working out will loosen up your tissues and help with improving the range of motion but do not use heat for the first two weeks following surgery (sometimes heat can increase post-operative swelling).  ° °These exercises can be done on a training (exercise) mat, on the floor, on a table or on a bed. Use whatever works the best and is most comfortable for you.    Use music or television while you are exercising so that   the exercises are a pleasant break in your day. This will make your life better with the exercises acting as a break in your routine that you can look forward to.   Perform all exercises about fifteen times, three times per day or as directed.  You should exercise both the operative leg and the other leg as well. ° °Exercises include: °  °• Quad Sets - Tighten up the muscle on the front of the thigh (Quad) and hold for 5-10 seconds.   °• Straight Leg Raises - With your knee straight (if you were given a brace, keep it on),  lift the leg to 60 degrees, hold for 3 seconds, and slowly lower the leg.  Perform this exercise against resistance later as your leg gets stronger.  °• Leg Slides: Lying on your back, slowly slide your foot toward your buttocks, bending your knee up off the floor (only go as far as is comfortable). Then slowly slide your foot back down until your leg is flat on the floor again.  °• Angel Wings: Lying on your back spread your legs to the side as far apart as you can without causing discomfort.  °• Hamstring Strength:  Lying on your back, push your heel against the floor with your leg straight by tightening up the muscles of your buttocks.  Repeat, but this time bend your knee to a comfortable angle, and push your heel against the floor.  You may put a pillow under the heel to make it more comfortable if necessary.  ° °A rehabilitation program following joint replacement surgery can speed recovery and prevent re-injury in the future due to weakened muscles. Contact your doctor or a physical therapist for more information on knee rehabilitation.  ° ° °CONSTIPATION ° °Constipation is defined medically as fewer than three stools per week and severe constipation as less than one stool per week.  Even if you have a regular bowel pattern at home, your normal regimen is likely to be disrupted due to multiple reasons following surgery.  Combination of anesthesia, postoperative narcotics, change in appetite and fluid intake all can affect your bowels.  ° °YOU MUST use at least one of the following options; they are listed in order of increasing strength to get the job done.  They are all available over the counter, and you may need to use some, POSSIBLY even all of these options:   ° °Drink plenty of fluids (prune juice may be helpful) and high fiber foods °Colace 100 mg by mouth twice a day  °Senokot for constipation as directed and as needed Dulcolax (bisacodyl), take with full glass of water  °Miralax (polyethylene glycol)  once or twice a day as needed. ° °If you have tried all these things and are unable to have a bowel movement in the first 3-4 days after surgery call either your surgeon or your primary doctor.   ° °If you experience loose stools or diarrhea, hold the medications until you stool forms back up.  If your symptoms do not get better within 1 week or if they get worse, check with your doctor.  If you experience "the worst abdominal pain ever" or develop nausea or vomiting, please contact the office immediately for further recommendations for treatment. ° ° °ITCHING:  If you experience itching with your medications, try taking only a single pain pill, or even half a pain pill at a time.  You can also use Benadryl over the counter for itching or also to   help with sleep.   TED HOSE STOCKINGS:  Use stockings on both legs until for at least 2 weeks or as directed by physician office. They may be removed at night for sleeping.  MEDICATIONS:  See your medication summary on the After Visit Summary that nursing will review with you.  You may have some home medications which will be placed on hold until you complete the course of blood thinner medication.  It is important for you to complete the blood thinner medication as prescribed.  PRECAUTIONS:  If you experience chest pain or shortness of breath - call 911 immediately for transfer to the hospital emergency department.   If you develop a fever greater that 101 F, purulent drainage from wound, increased redness or drainage from wound, foul odor from the wound/dressing, or calf pain - CONTACT YOUR SURGEON.                                                   FOLLOW-UP APPOINTMENTS:  If you do not already have a post-op appointment, please call the office for an appointment to be seen by your surgeon.  Guidelines for how soon to be seen are listed in your After Visit Summary, but are typically between 1-4 weeks after surgery.  OTHER INSTRUCTIONS:   Knee  Replacement:  Do not place pillow under knee, focus on keeping the knee straight while resting. CPM instructions: 0-90 degrees, 2 hours in the morning, 2 hours in the afternoon, and 2 hours in the evening. Place foam block, curve side up under heel at all times except when in CPM or when walking.  DO NOT modify, tear, cut, or change the foam block in any way.  MAKE SURE YOU:   Understand these instructions.   Get help right away if you are not doing well or get worse.    Thank you for letting us be a part of your medical care team.  It is a privilege we respect greatly.  We hope these instructions will help you stay on track for a fast and full recovery!   [facility] Stay Proper nutrition can help your body recover from illness and injury.   Foods and beverages high in protein, vitamins, and minerals help rebuild muscle loss, promote healing, & reduce fall risk.   In addition to eating healthy foods, a nutrition shake is an easy, delicious way to get the nutrition you need during and after your hospital stay  It is recommended that you continue to drink 2 bottles per day of:       Ensure/Boost for at least 1 month (30 days) after your hospital stay   Tips for adding a nutrition shake into your routine: As allowed, drink one with vitamins or medications instead of water or juice Enjoy one as a tasty mid-morning or afternoon snack Drink cold or make a milkshake out of it Drink one instead of milk with cereal or snacks Use as a coffee creamer   Available at the following grocery stores and pharmacies:           * [place]      * [employer]  * [employer]   * [facility]  *  [employer]   * [employer] [phone]            For COUPONS visit: www.ensure.com/join or RoleLink.com.br   Suggested Substitutions Ensure Plus = Boost Plus = Carnation Breakfast Essentials = Boost  Compact Ensure Active Clear = Boost Breeze Glucerna Shake = Boost Glucose Control = Carnation Breakfast Essentials SUGAR FREE

## 2019-02-27 NOTE — Progress Notes (Signed)
Pt stable at time of d/c. Pt was given home equipment prior to d/c. No needs at this time.

## 2019-02-27 NOTE — Discharge Summary (Signed)
Discharge Summary  Madison Young YNW:[id number] DOB: 18-Jun-1940  PCP: Patient, No Pcp Per  Admit date: 02/25/2019 Discharge date: 02/27/2019   Recommendations for Outpatient Follow-up:  1. PCP 2 weeks  2. Orthopedic surgery as scheduled  Discharge Diagnoses:  Active Hospital Problems   Diagnosis Date Noted  . Closed fracture of right hip (HCC)   . Acute blood loss anemia 02/26/2019  . Femur fracture, right (HCC) 02/25/2019    Resolved Hospital Problems  No resolved problems to display.    Discharge Condition: Stable   Diet recommendation: Regular   Vitals:   02/26/19 2122 02/27/19 0338  BP: (!) 141/69 (!) 145/76  Pulse: 79 82  Resp: 16 16  Temp: 98 F (36.7 C) 98.7 F (37.1 C)  SpO2: 93% 90%    HPI and Hospital Course:  Healthy 89 female admitted 10/30 with R hip fracture after mechanical fall at home, underwent R THA on that date has done well and plans to go home with HHPT later today. Orthopedics has ordered weight bearing status, ASA BID for DVT PPX and I have arranged for HHPT/OT as well as necessary DME. Patient denies pain, nausea, shortness of breath or chest pain and says her [place] is arriving from [place] later today.  Discharge details, plan of care and follow up instructions were discussed with patient and any available family or care providers. Patient and family are in agreement with discharge from the hospital today and all questions were answered to their satisfaction.  Procedures:  R THA 10.30   Consultations:  Orthopedic surgery  PT   Discharge Exam: BP (!) 145/76 (BP Location: Left Arm)   Pulse 82   Temp 98.7 F (37.1 C) (Oral)   Resp 16   Ht 5\' 1"  (1.549 m)   Wt 42.6 kg   SpO2 90%   BMI 17.76 kg/m  General:  Alert, oriented, calm, in no acute distress  Eyes: EOMI, clear sclerea Neck: supple, no masses, trachea mildline  Cardiovascular: RRR, no murmurs or rubs, no peripheral edema  Respiratory: clear to auscultation  bilaterally, no wheezes, no crackles  Abdomen: soft, nontender, nondistended, normal bowel tones heard  Skin: dry, no rashes  Musculoskeletal: no joint effusions, normal range of motion  Psychiatric: appropriate affect, normal speech  Neurologic: extraocular muscles intact, clear speech, moving all extremities with intact sensorium    Discharge Instructions You were cared for by a hospitalist during your hospital stay. If you have any questions about your discharge medications or the care you received while you were in the hospital after you are discharged, you can call the unit and asked to speak with the hospitalist on call if the hospitalist that took care of you is not available. Once you are discharged, your primary care physician will handle any further medical issues. Please note that NO REFILLS for any discharge medications will be authorized once you are discharged, as it is imperative that you return to your primary care physician (or establish a relationship with a primary care physician if you do not have one) for your aftercare needs so that they can reassess your need for medications and monitor your lab values.  Discharge Instructions    Diet - low sodium heart healthy   Complete by: As directed    Increase activity slowly   Complete by: As directed      Allergies as of 02/27/2019   No Known Allergies     Medication List    TAKE these medications  acetaminophen 325 MG tablet Commonly known as: TYLENOL Take 1-2 tablets (325-650 mg total) by mouth every 6 (six) hours as needed for mild pain (pain score 1-3 or temp > 100.5).   aspirin 81 MG chewable tablet Chew 1 tablet (81 mg total) by mouth 2 (two) times daily.   CALCIUM PO Take 1 tablet by mouth 2 (two) times daily.   HYDROcodone-acetaminophen 5-325 MG tablet Commonly known as: NORCO/VICODIN Take 1-2 tablets by mouth every 6 (six) hours as needed for moderate pain (pain score 4-6).   PRESERVISION AREDS PO Take 1  capsule by mouth 2 (two) times daily.            Durable Medical Equipment  (From admission, onward)         Start     Ordered   02/27/19 0750  For home use only DME 3 n 1  Once     02/27/19 0750   02/27/19 0750  For home use only DME Walker rolling  Once    Question:  Patient needs a walker to treat with the following condition  Answer:  Status post hip surgery   02/27/19 0750   02/26/19 1528  For home use only DME 3 n 1  Once     02/26/19 1528   02/26/19 1528  For home use only DME Walker rolling  Once    Question:  Patient needs a walker to treat with the following condition  Answer:  Hip fracture ([place])   02/26/19 1528   02/25/19 2035  DME 3 n 1  Once     02/25/19 2034   02/25/19 2035  DME Walker rolling  Once    Question:  Patient needs a walker to treat with the following condition  Answer:  Status post total replacement of right hip   02/25/19 2034         No Known Allergies Follow-up Information    Mcarthur Rossetti, MD. Schedule an appointment as soon as possible for a visit in 2 week(s).   Specialty: Orthopedic Surgery Why: Our new address is [address] This is next door to our old Interior and spatial designer information: [place] [address] [phone]            The results of significant diagnostics from this hospitalization (including imaging, microbiology, ancillary and laboratory) are listed below for reference.    Significant Diagnostic Studies: Dg Chest 1 View  Result Date: 02/25/2019 CLINICAL DATA:  Preop exam.  Fall today. EXAM: CHEST  1 VIEW COMPARISON:  06/30/2018. FINDINGS: Mediastinum and hilar structures normal. Heart size normal. COPD. Bilateral pleural-parenchymal thickening again noted consistent scarring. No pleural effusion or pneumothorax. IMPRESSION: COPD. Bilateral pleural-parenchymal thickening noted consistent scarring. Electronically Signed   By: Marcello Moores  Register   On: 02/25/2019 12:19   Dg  Pelvis Portable  Result Date: 02/25/2019 CLINICAL DATA:  Postop EXAM: PORTABLE PELVIS 1-2 VIEWS COMPARISON:  02/25/2019 FINDINGS: Interval right hip replacement with normal alignment and intact hardware. Gas in the soft tissues of the right thigh consistent with recent surgery. Vascular calcifications. IMPRESSION: Interval right hip replacement with expected postsurgical changes. Electronically Signed   By: Donavan Foil M.D.   On: 02/25/2019 18:56   Dg C-arm 1-60 Min-no Report  Result Date: 02/25/2019 Fluoroscopy was utilized by the requesting physician.  No radiographic interpretation.   Dg Hip Operative Unilat W Or W/o Pelvis Right  Result Date: 02/25/2019 CLINICAL DATA:  Portable operative imaging provided for right hip  arthroplasty. EXAM: OPERATIVE RIGHT HIP (WITH PELVIS IF PERFORMED) 2 VIEWS TECHNIQUE: Fluoroscopic spot image(s) were submitted for interpretation post-operatively. COMPARISON:  02/25/2019 at 12:06 p.m. FINDINGS: Two submitted images show placement of a total right hip arthroplasty. The femoral and acetabular components appear well seated and aligned. No evidence of an operative complication. IMPRESSION: Well-positioned total right hip arthroplasty Electronically Signed   By: Amie Portland M.D.   On: 02/25/2019 18:10   Dg Hip Unilat With Pelvis 2-3 Views Right  Result Date: 02/25/2019 CLINICAL DATA:  Right hip pain and right leg shortening following a fall today. EXAM: DG HIP (WITH OR WITHOUT PELVIS) 2-3V RIGHT COMPARISON:  None. FINDINGS: Right femoral neck fracture with varus angulation and proximal displacement of the distal fragment. Atheromatous arterial calcifications. IMPRESSION: Right femoral neck fracture, as described above. Electronically Signed   By: Beckie Salts M.D.   On: 02/25/2019 12:21    Microbiology: Recent Results (from the past 240 hour(s))  SARS Coronavirus 2 by RT PCR (hospital order, performed in [facility] hospital lab) Nasopharyngeal  Nasopharyngeal Swab     Status: None   Collection Time: 02/25/19  2:20 PM   Specimen: Nasopharyngeal Swab  Result Value Ref Range Status   SARS Coronavirus 2 NEGATIVE NEGATIVE Final    Comment: (NOTE) If result is NEGATIVE SARS-CoV-2 target nucleic acids are NOT DETECTED. The SARS-CoV-2 RNA is generally detectable in upper and lower  respiratory specimens during the acute phase of infection. The lowest  concentration of SARS-CoV-2 viral copies this assay can detect is 250  copies / mL. A negative result does not preclude SARS-CoV-2 infection  and should not be used as the sole basis for treatment or other  patient management decisions.  A negative result may occur with  improper specimen collection / handling, submission of specimen other  than nasopharyngeal swab, presence of viral mutation(s) within the  areas targeted by this assay, and inadequate number of viral copies  (<250 copies / mL). A negative result must be combined with clinical  observations, patient history, and epidemiological information. If result is POSITIVE SARS-CoV-2 target nucleic acids are DETECTED. The SARS-CoV-2 RNA is generally detectable in upper and lower  respiratory specimens dur ing the acute phase of infection.  Positive  results are indicative of active infection with SARS-CoV-2.  Clinical  correlation with patient history and other diagnostic information is  necessary to determine patient infection status.  Positive results do  not rule out bacterial infection or co-infection with other viruses. If result is PRESUMPTIVE POSTIVE SARS-CoV-2 nucleic acids MAY BE PRESENT.   A presumptive positive result was obtained on the submitted specimen  and confirmed on repeat testing.  While 2019 novel coronavirus  (SARS-CoV-2) nucleic acids may be present in the submitted sample  additional confirmatory testing may be necessary for epidemiological  and / or clinical management purposes  to differentiate between   SARS-CoV-2 and other Sarbecovirus currently known to infect humans.  If clinically indicated additional testing with an alternate test  methodology [phone]) is advised. The SARS-CoV-2 RNA is generally  detectable in upper and lower respiratory sp ecimens during the acute  phase of infection. The expected result is Negative. Fact Sheet for Patients:  BoilerBrush.com.cy Fact Sheet for Healthcare Providers: https://pope.com/ This test is not yet approved or cleared by the [place] FDA and has been authorized for detection and/or diagnosis of SARS-CoV-2 by FDA under an Emergency Use Authorization (EUA).  This EUA will remain in effect (meaning this test  can be used) for the duration of the COVID-19 declaration under Section 564(b)(1) of the Act, 21 U.S.C. section 360bbb-3(b)(1), unless the authorization is terminated or revoked sooner. Performed at [facility], [address], [place], [place] [address]      Labs: Basic Metabolic Panel: Recent Labs  Lab 02/25/19 1119 02/26/19 0251  NA 138 135  K 3.7 4.2  CL 99 100  CO2 30 25  GLUCOSE 94 168*  BUN 16 16  CREATININE 0.92 0.94  CALCIUM 9.4 8.4*   Liver Function Tests: No results for input(s): AST, ALT, ALKPHOS, BILITOT, PROT, ALBUMIN in the last 168 hours. No results for input(s): LIPASE, AMYLASE in the last 168 hours. No results for input(s): AMMONIA in the last 168 hours. CBC: Recent Labs  Lab 02/25/19 1119 02/26/19 0251 02/27/19 0331  WBC 5.5 9.4 7.7  NEUTROABS 3.8  --   --   HGB 11.1* 9.6* 8.5*  HCT 34.1* 29.8* 26.7*  MCV 97.2 97.1 96.7  PLT 198 171 144*   Cardiac Enzymes: No results for input(s): CKTOTAL, CKMB, CKMBINDEX, TROPONINI in the last 168 hours. BNP: BNP (last 3 results) No results for input(s): BNP in the last 8760 hours.  ProBNP (last 3 results) No results for input(s): PROBNP in the last 8760 hours.  CBG: No results for  input(s): GLUCAP in the last 168 hours.  Time spent: 41 minutes were spent in preparing this discharge including medication reconciliation, counseling, and coordination of care.  Signed:  Janylah Belgrave Vergie Living, MD  [facility] 02/27/2019, 7:54 AM

## 2019-02-27 NOTE — Progress Notes (Signed)
Physical Therapy Treatment Patient Details Name: Madison Young MRN: [id number] DOB: Jul 15, 1940 Today's Date: 02/27/2019    History of Present Illness Pt s/p fall with R hip fx and now s/p R THR by anterior approach    PT Comments    Pt progressing well with mobility but continues very impulsive and requiring cues and CGA for safety.   Follow Up Recommendations  Home health PT     Equipment Recommendations  Rolling walker with 5" wheels    Recommendations for Other Services OT consult     Precautions / Restrictions Precautions Precautions: Fall Restrictions Weight Bearing Restrictions: No Other Position/Activity Restrictions: WBAT    Mobility  Bed Mobility Overal bed mobility: Needs Assistance Bed Mobility: Supine to Sit     Supine to sit: Min guard     General bed mobility comments: cues for use of gait belt to self assist moving R LE  Transfers Overall transfer level: Needs assistance Equipment used: Rolling walker (2 wheeled) Transfers: Sit to/from Stand Sit to Stand: Min guard         General transfer comment: cues for LE management and use of UEs to self assist  Ambulation/Gait Ambulation/Gait assistance: Min guard Gait Distance (Feet): 125 Feet Assistive device: Rolling walker (2 wheeled) Gait Pattern/deviations: Step-to pattern;Step-through pattern;Decreased step length - right;Decreased step length - left;Shuffle;Trunk flexed Gait velocity: decr   General Gait Details: cues for safety, sequence, posture and position from RW;   Stairs             Wheelchair Mobility    Modified Rankin (Stroke Patients Only)       Balance Overall balance assessment: Needs assistance;History of Falls Sitting-balance support: No upper extremity supported;Feet supported Sitting balance-Leahy Scale: Good     Standing balance support: Bilateral upper extremity supported Standing balance-Leahy Scale: Fair                               Cognition Arousal/Alertness: Awake/alert Behavior During Therapy: Impulsive;WFL for tasks assessed/performed Overall Cognitive Status: Within Functional Limits for tasks assessed                                        Exercises      General Comments        Pertinent Vitals/Pain Pain Assessment: 0-10 Pain Score: 2  Pain Location: R hip Pain Descriptors / Indicators: Throbbing Pain Intervention(s): Limited activity within patient's tolerance;Monitored during session;Premedicated before session;Ice applied    Home Living                      Prior Function            PT Goals (current goals can now be found in the care plan section) Acute Rehab PT Goals Patient Stated Goal: go home PT Goal Formulation: With patient Time For Goal Achievement: 03/05/19 Potential to Achieve Goals: Good Progress towards PT goals: Progressing toward goals    Frequency    7X/week      PT Plan Current plan remains appropriate    Co-evaluation              AM-PAC PT "6 Clicks" Mobility   Outcome Measure  Help needed turning from your back to your side while in a flat bed without using bedrails?: A Little Help needed moving from lying on  your back to sitting on the side of a flat bed without using bedrails?: A Little Help needed moving to and from a bed to a chair (including a wheelchair)?: A Little Help needed standing up from a chair using your arms (e.g., wheelchair or bedside chair)?: A Little Help needed to walk in hospital room?: A Little Help needed climbing 3-5 steps with a railing? : A Little 6 Click Score: 18    End of Session Equipment Utilized During Treatment: Gait belt Activity Tolerance: Patient tolerated treatment well Patient left: in chair;with call bell/phone within reach;with chair alarm set Nurse Communication: Mobility status PT Visit Diagnosis: Difficulty in walking, not elsewhere classified (R26.2)     Time: 1610-9604 PT  Time Calculation (min) (ACUTE ONLY): 29 min  Charges:  $Gait Training: 23-37 mins                     [place] Pager [phone] Office [phone]    Addilyne Backs 02/27/2019, 12:46 PM

## 2019-02-27 NOTE — Plan of Care (Addendum)
Pt to dc home with family. No needs at this time. Home equipment delivered to room.

## 2019-02-27 NOTE — Progress Notes (Signed)
    Home health agencies that serve [phone].        Rule Quality of Patient Care Rating Patient Survey Summary Rating  [facility] [phone] 4  out of 5 stars 5 out of [place] [phone] 2  out of 5 stars 4 out of [place] [phone] 4  out of 5 stars 4 out of [place] [phone] 4 out of 5 stars 4 out of [place] [phone] 4 out of 5 stars 4 out of 5 stars  [facility] [place] [phone] 3  out of 5 stars 4 out of [place] [phone] 3 out of 5 stars 4 out of 5 stars  [facility] [phone] Not Available4 Not [place] [phone] 3  out of 5 stars 5 out of 5 stars  [facility] [phone] 4  out of 5 stars 3 out of [place] [phone] 3  out of 5 stars 4 out of [place] [phone] 3  out of 5 stars 4 out of [place] [phone] 4  out of 5 stars 4 out of [place] number Footnote as displayed on [place]  1 This agency provides services under a federal waiver program to non-traditional, chronic long term population.  2 This agency provides services to a special needs population.  3 Not Available.  4 The number of patient episodes for this measure is too small to report.  5 This measure currently does not have data or provider has been certified/recertified for less than 6 months.  6 The national average for this measure is not provided because of state-to-state differences in data collection.  7 Medicare is not displaying rates for this measure for any home health agency, because of an issue with the data.  8  There were problems with the data and they are being corrected.  9 Zero, or very few, patients met the survey's rules for inclusion. The scores shown, if any, reflect a very small number of surveys and may not accurately tell how an agency is doing.  10 Survey results are based on less than 12 months of data.  11 Fewer than 70 patients completed the survey. Use the scores shown, if any, with caution as the number of surveys may be too low to accurately tell how an agency is doing.  12 No survey results are available for this period.  13 Data suppressed by CMS for one or more quarters.

## 2019-02-28 ENCOUNTER — Encounter (HOSPITAL_COMMUNITY): Payer: Self-pay | Admitting: Orthopaedic Surgery

## 2019-03-01 ENCOUNTER — Telehealth: Payer: Self-pay | Admitting: Orthopaedic Surgery

## 2019-03-01 NOTE — Telephone Encounter (Signed)
Verbal order given  

## 2019-03-01 NOTE — Telephone Encounter (Signed)
[facility]an with [facility] called in requesting verbal order for occupational therapy for this pt for for 2 times a week for 4 weeks.     [phone]

## 2019-03-02 ENCOUNTER — Telehealth: Payer: Self-pay | Admitting: Orthopaedic Surgery

## 2019-03-02 NOTE — Telephone Encounter (Signed)
Jennine from [facility] called to request VO for [facility] PT for the following:  2x a week for 4 weeks.  CB#[phone].  Thank you.

## 2019-03-02 NOTE — Telephone Encounter (Signed)
Verbal order given  

## 2019-03-10 ENCOUNTER — Encounter: Payer: Self-pay | Admitting: Orthopaedic Surgery

## 2019-03-10 ENCOUNTER — Ambulatory Visit (INDEPENDENT_AMBULATORY_CARE_PROVIDER_SITE_OTHER): Payer: Medicare Other | Admitting: Orthopaedic Surgery

## 2019-03-10 ENCOUNTER — Other Ambulatory Visit: Payer: Self-pay

## 2019-03-10 DIAGNOSIS — Z96641 Presence of right artificial hip joint: Secondary | ICD-10-CM

## 2019-03-10 NOTE — Progress Notes (Signed)
The patient is 2 weeks tomorrow status post a right total hip arthroplasty.  This was done acutely after a mechanical fall in which she sustained a significant displaced right hip femoral neck fracture.  She is someone that is very active.  She does not walk with any assistive device prior to this injury which was a mechanical fall.  She says she is doing well.  She says therapy is going continue to come out there for several weeks.  She is on a walker right now.  On examination her right hip incision looks good.  I did place new Steri-Strips.  She does have a small seroma drained about 30 cc of fluid from the soft tissue.  It was easily see because she is very thin.  Her leg lengths are equal.  She tolerates me putting her right hip the range of motion.  She continue creaser activities as comfort allows.  We will see her back in 4 weeks to see how she is doing overall.  No x-rays are needed.

## 2019-03-11 ENCOUNTER — Telehealth: Payer: Self-pay | Admitting: Radiology

## 2019-03-11 NOTE — Telephone Encounter (Signed)
I called patient and advised. 

## 2019-03-11 NOTE — Telephone Encounter (Signed)
Patient left voicemail on triage line stating she had fluid drained from incision yesterday and it is a little puffy today. She did not know if there is something else that you would like for her to do, should she just watch it, or should she follow up? Please advise.  CB [phone]

## 2019-03-11 NOTE — Telephone Encounter (Signed)
She should expect it to still have swelling.  She can try a heating pad intermittently.  She does not need to be seen except at her next follow-up unless it swells significantly more.

## 2019-04-07 ENCOUNTER — Ambulatory Visit (INDEPENDENT_AMBULATORY_CARE_PROVIDER_SITE_OTHER): Payer: Medicare Other | Admitting: Orthopaedic Surgery

## 2019-04-07 ENCOUNTER — Encounter: Payer: Self-pay | Admitting: Orthopaedic Surgery

## 2019-04-07 ENCOUNTER — Other Ambulatory Visit: Payer: Self-pay

## 2019-04-07 DIAGNOSIS — Z96641 Presence of right artificial hip joint: Secondary | ICD-10-CM

## 2019-04-07 NOTE — Progress Notes (Signed)
HPI: [name] returns today 5 weeks 6 days status post right total hip arthroplasty secondary to displaced right hip femoral neck fracture.  Is overall doing well.  She has no concerns.  States her incision is healing well.  She does note some swelling lateral aspect of the hip.  Physical exam: Right hip surgical incisions well-healed.  No significant seroma.  No abnormal warmth erythema.  Good range of motion of the right hip without significant pain.  Right calf supple nontender.  Dorsiflexion plantarflexion of the right ankle intact.  Ambulates without any assistive device.  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  Follow-up with [place] in 3 months for AP pelvis lateral view of her right hip.  This will be just prior to her moving back to [place].  Questions are encouraged and answered at length today.  Follow-up sooner if there is any concerns

## 2019-07-06 ENCOUNTER — Other Ambulatory Visit: Payer: Self-pay

## 2019-07-06 ENCOUNTER — Ambulatory Visit (INDEPENDENT_AMBULATORY_CARE_PROVIDER_SITE_OTHER): Payer: Medicare Other | Admitting: Physician Assistant

## 2019-07-06 ENCOUNTER — Ambulatory Visit (INDEPENDENT_AMBULATORY_CARE_PROVIDER_SITE_OTHER): Payer: Medicare Other

## 2019-07-06 ENCOUNTER — Encounter: Payer: Self-pay | Admitting: Physician Assistant

## 2019-07-06 ENCOUNTER — Ambulatory Visit: Payer: Medicare Other | Admitting: Orthopaedic Surgery

## 2019-07-06 DIAGNOSIS — Z96641 Presence of right artificial hip joint: Secondary | ICD-10-CM

## 2019-07-06 NOTE — Progress Notes (Signed)
   Office Visit Note   Patient: Madison Young           Date of Birth: [birth year]           MRN: [id number] Visit Date: 07/06/2019              Requested by: No referring provider defined for this encounter. PCP: Patient, No Pcp Per   Assessment & Plan: Visit Diagnoses:  1. Status post total replacement of right hip     Plan: She is activities as tolerated.  She will follow-up with [place] as needed or follow-up with an orthopedic surgeon when she reaches [place] recommended AP pelvis and lateral view of the right hip pending year postop.  Questions were encouraged and answered  Follow-Up Instructions: Return if symptoms worsen or fail to improve.   Orders:  Orders Placed This Encounter  Procedures  . XR HIP UNILAT W OR W/O PELVIS 2-3 VIEWS RIGHT   No orders of the defined types were placed in this encounter.     Procedures: No procedures performed   Clinical Data: No additional findings.   Subjective: Chief Complaint  Patient presents with  . Right Hip - Follow-up    HPI Madison Young comes in today now 4 months status post right total hip arthroplasty.  She states she is doing well.  She has no complaints.  She is happy with the hip.  She is moving to [place] soon. Review of Systems Negative for fevers chills shortness of breath chest  Objective: Vital Signs: There were no vitals taken for this visit.  Physical Exam Constitutional:      Appearance: She is normal weight. She is not ill-appearing or diaphoretic.  Neurological:     Mental Status: She is alert and oriented to person, place, and time.  Psychiatric:        Mood and Affect: Mood normal.     Ortho Exam Right hip full range of motion without pain.  Dorsiflexion plantarflexion right foot intact.  Calf supple nontender.  Ambulates without any assistive device.  Specialty Comments:  No specialty comments available.  Imaging: XR HIP UNILAT W OR W/O PELVIS 2-3 VIEWS RIGHT  Result Date: 07/06/2019 AP  pelvis lateral view of the right hip: Bilateral hips well located.  Status post right total hip arthroplasty with well-seated components.  No acute fractures or bony abnormalities.    PMFS History: Patient Active Problem List   Diagnosis Date Noted  . Acute blood loss anemia 02/26/2019  . Femur fracture, right (HCC) 02/25/2019  . Closed fracture of right hip (HCC)    History reviewed. No pertinent past medical history.  Family History  Family history unknown: Yes    Past Surgical History:  Procedure Laterality Date  . TOTAL HIP ARTHROPLASTY Right 02/25/2019   Procedure: TOTAL HIP ARTHROPLASTY ANTERIOR APPROACH;  Surgeon: Kathryne Hitch, MD;  Location: [facility];  Service: Orthopedics;  Laterality: Right;   Social History   Occupational History  . Not on file  Tobacco Use  . Smoking status: Never Smoker  . Smokeless tobacco: Never Used  Substance and Sexual Activity  . Alcohol use: Yes  . Drug use: No  . Sexual activity: Not on file

## 2019-07-27 ENCOUNTER — Emergency Department (HOSPITAL_COMMUNITY): Payer: Medicare Other

## 2019-07-27 ENCOUNTER — Other Ambulatory Visit: Payer: Self-pay

## 2019-07-27 ENCOUNTER — Encounter (HOSPITAL_COMMUNITY): Payer: Self-pay | Admitting: Emergency Medicine

## 2019-07-27 ENCOUNTER — Emergency Department (HOSPITAL_COMMUNITY)
Admission: EM | Admit: 2019-07-27 | Discharge: 2019-07-27 | Disposition: A | Discharge status: Home / Self Care | Payer: Medicare Other | Attending: Emergency Medicine | Admitting: Emergency Medicine

## 2019-07-27 DIAGNOSIS — Y999 Unspecified external cause status: Secondary | ICD-10-CM | POA: Diagnosis not present

## 2019-07-27 DIAGNOSIS — S8991XA Unspecified injury of right lower leg, initial encounter: Secondary | ICD-10-CM | POA: Diagnosis present

## 2019-07-27 DIAGNOSIS — W19XXXA Unspecified fall, initial encounter: Secondary | ICD-10-CM | POA: Diagnosis not present

## 2019-07-27 DIAGNOSIS — Y9301 Activity, walking, marching and hiking: Secondary | ICD-10-CM | POA: Diagnosis not present

## 2019-07-27 DIAGNOSIS — Z96641 Presence of right artificial hip joint: Secondary | ICD-10-CM | POA: Diagnosis not present

## 2019-07-27 DIAGNOSIS — S82001A Unspecified fracture of right patella, initial encounter for closed fracture: Secondary | ICD-10-CM | POA: Diagnosis not present

## 2019-07-27 DIAGNOSIS — Y92019 Unspecified place in single-family (private) house as the place of occurrence of the external cause: Secondary | ICD-10-CM | POA: Insufficient documentation

## 2019-07-27 NOTE — Discharge Instructions (Signed)
You were seen in the emergency department today with knee pain after a fall.  You have a very small fracture of your kneecap.  We have placed you in a knee immobilizer which needs to stay in place.  You cannot bend your knee.  You are able to walk while in the knee immobilizer as Madison Young as you are not bending your knee.  I spoke with your orthopedic surgeon's office and they would like to see you in the office on Monday.  Please call to schedule this appointment.  Take Tylenol as needed for pain and apply ice over the next 12 hours to the knee to reduce swelling and keep it elevated.

## 2019-07-27 NOTE — ED Notes (Signed)
Patient transported to x-ray. ?

## 2019-07-27 NOTE — ED Triage Notes (Signed)
Patient here from home reporting right knee pain after fall today. Ambulatory.

## 2019-07-27 NOTE — ED Provider Notes (Signed)
Emergency Department Provider Note   I have reviewed the triage vital signs and the nursing notes.   HISTORY  Chief Complaint Fall and Knee Pain   HPI Madison Young is a 79 y.o. female with PMH of total hip replacement on the right presents to the emergency department with mechanical fall and right knee pain.  Patient states that she was walking around her house when she fell landing on her right knee.  She has some swelling with mild bruising.  She has pain with putting weight in the area.  She is not feeling pain in her hip or ankle.  She did not sustain head injury.  She states she did hit her chin but there is no bleeding.  There was no loss of consciousness.  She is not having head or face pain right now.  No FOOSH injury mechanism.  She has seen Dr. Ninfa Linden for her hip fracture in the past and call the office but they were closing and she presents to the ED for evaluation.   History reviewed. No pertinent past medical history.  Patient Active Problem List   Diagnosis Date Noted  . Acute blood loss anemia 02/26/2019  . Femur fracture, right ([place]) 02/25/2019  . Closed fracture of right hip [facility])     Past Surgical History:  Procedure Laterality Date  . TOTAL HIP ARTHROPLASTY Right 02/25/2019   Procedure: TOTAL HIP ARTHROPLASTY ANTERIOR APPROACH;  Surgeon: Mcarthur Rossetti, MD;  Location: [facility];  Service: Orthopedics;  Laterality: Right;    Allergies Patient has no known allergies.  Family History  Family history unknown: Yes    Social History Social History   Tobacco Use  . Smoking status: Never Smoker  . Smokeless tobacco: Never Used  Substance Use Topics  . Alcohol use: Yes  . Drug use: No    Review of Systems  Cardiovascular: Denies chest pain. Denies syncope.  Respiratory: Denies shortness of breath. Gastrointestinal: No abdominal pain.   Musculoskeletal: Negative for back pain. Positive right knee pain.  Skin: Negative for rash.    Neurological: Negative for headaches, focal weakness or numbness.  10-point ROS otherwise negative.  ____________________________________________   PHYSICAL EXAM:  VITAL SIGNS: ED Triage Vitals  Enc Vitals Group     BP 07/27/19 1915 (!) 191/84     Pulse Rate 07/27/19 1915 73     Resp 07/27/19 1915 16     Temp 07/27/19 1915 98.2 F (36.8 C)     Temp Source 07/27/19 1915 Oral     SpO2 07/27/19 1915 95 %   Constitutional: Alert and oriented. Well appearing and in no acute distress. Eyes: Conjunctivae are normal.  Head: Atraumatic. Nose: No congestion/rhinnorhea. Neck: No stridor.   Cardiovascular: Good peripheral circulation. Well perfused.  Respiratory: Normal respiratory effort.   Gastrointestinal: No distention.  Musculoskeletal: Mild edema with faint ecchymosis over the right knee with limited ROM. Normal passive ROM of the right hip and ankle. No bony tenderness or deformity over these areas. Mild tenderness over the right patella but tracking well.  Neurologic:  Normal speech and language.  Skin:  Skin is warm, dry and intact. No rash noted.  ____________________________________________  RADIOLOGY  DG Knee Complete 4 Views Right  Result Date: 07/27/2019 CLINICAL DATA:  Recent fall with anterior knee pain, initial encounter EXAM: RIGHT KNEE - COMPLETE 4+ VIEW COMPARISON:  None. FINDINGS: Undisplaced patellar fracture is noted anterior laterally. Joint effusion is seen. No other fracture or dislocation is noted. No  other soft tissue abnormality is seen. IMPRESSION: Undisplaced patellar fracture anterior laterally. Positive joint effusion is seen. Electronically Signed   By: Alcide Clever M.D.   On: 07/27/2019 19:49    ____________________________________________   PROCEDURES  Procedure(s) performed:   Procedures  None  ____________________________________________   INITIAL IMPRESSION / ASSESSMENT AND PLAN / ED COURSE  Pertinent labs & imaging results that  were available during my care of the patient were reviewed by me and considered in my medical decision making (see chart for details).   Patient presents to the emergency department with right knee pain after fall.  Mild swelling with ecchymosis.  No tenderness over the hip or ankle on the right.  No evidence of head injury.  No syncope/presyncope symptoms prior to falling.  Plan for plain films of the right knee and will reassess.   08:32 PM  Spoke with Dr. August Saucer regarding the plain films.  He reviewed the images.  He recommends placing the patient in a knee immobilizer but that she can weight-bear as tolerated while in the immobilizer as Madison Young as she does not bend her knee.  She needs to call the office to schedule an appointment to see him on Monday.  Discussed this with the patient.  She states that her pain is well controlled and we are applying the knee immobilizer.  She verbalizes understanding to keep the immobilizer on at all times and that she can walk without bending the knee.  She is ambulatory here without assistance.  ____________________________________________  FINAL CLINICAL IMPRESSION(S) / ED DIAGNOSES  Final diagnoses:  Fall, initial encounter  Closed nondisplaced fracture of right patella, unspecified fracture morphology, initial encounter    Note:  This document was prepared using Dragon voice recognition software and may include unintentional dictation errors.  [name], MD, [facility] Emergency Medicine    Thorvald Orsino, Arlyss Repress, MD 07/27/19 2258

## 2019-08-02 ENCOUNTER — Encounter: Payer: Self-pay | Admitting: Orthopaedic Surgery

## 2019-08-02 ENCOUNTER — Ambulatory Visit (INDEPENDENT_AMBULATORY_CARE_PROVIDER_SITE_OTHER): Payer: Medicare Other | Admitting: Orthopaedic Surgery

## 2019-08-02 ENCOUNTER — Other Ambulatory Visit: Payer: Self-pay

## 2019-08-02 DIAGNOSIS — S82001A Unspecified fracture of right patella, initial encounter for closed fracture: Secondary | ICD-10-CM | POA: Diagnosis not present

## 2019-08-02 NOTE — Progress Notes (Signed)
   Office Visit Note   Patient: Madison Young           Date of Birth: August 24, 1940           MRN: [id number] Visit Date: 08/02/2019              Requested by: No referring provider defined for this encounter. PCP: Patient, No Pcp Per   Assessment & Plan: Visit Diagnoses:  1. Closed nondisplaced fracture of right patella, unspecified fracture morphology, initial encounter     Plan: She is given an short knee immobilizer which she will wear except for when bathing.  Explained the importance of her keeping leg straight.  Like to see her back in 8 days to repeat lateral view of the left knee.  Questions encouraged and answered at length.  Follow-Up Instructions: Return in about 8 days (around 08/10/2019) for Radiographs.   Orders:  No orders of the defined types were placed in this encounter.  No orders of the defined types were placed in this encounter.     Procedures: No procedures performed   Clinical Data: No additional findings.   Subjective: Chief Complaint  Patient presents with  . Right Knee - Injury    HPI Madison Young comes in today due to fall on 07/27/2019.  She was seen in the ER and found to have a nondisplaced patella fracture.  Films were reviewed by myself today and showed a nondisplaced mid patella fracture.  There is definite effusion in the knee. She reports that she was walking around her house when she fell on her right knee.  She denies any loss of consciousness.She ambulates with a walker.  States she is taking Tylenol for pain. Review of Systems See HPI  Objective: Vital Signs: There were no vitals taken for this visit.  Physical Exam Constitutional:      Appearance: She is normal weight. She is not ill-appearing or diaphoretic.  Pulmonary:     Effort: Pulmonary effort is normal.  Neurological:     Mental Status: She is alert and oriented to person, place, and time.  Psychiatric:        Mood and Affect: Mood normal.     Ortho  Exam Tenderness over the mid body of the right patella.  Slight ecchymosis slight edema.  No abnormal warmth.  No tenderness over the medial lateral joint line.  Is able do straight leg raise.  Calf supple nontender. Specialty Comments:  No specialty comments available.  Imaging: No results found.   PMFS History: Patient Active Problem List   Diagnosis Date Noted  . Acute blood loss anemia 02/26/2019  . Femur fracture, right (HCC) 02/25/2019  . Closed fracture of right hip (HCC)    History reviewed. No pertinent past medical history.  Family History  Family history unknown: Yes    Past Surgical History:  Procedure Laterality Date  . TOTAL HIP ARTHROPLASTY Right 02/25/2019   Procedure: TOTAL HIP ARTHROPLASTY ANTERIOR APPROACH;  Surgeon: Kathryne Hitch, MD;  Location: [facility];  Service: Orthopedics;  Laterality: Right;   Social History   Occupational History  . Not on file  Tobacco Use  . Smoking status: Never Smoker  . Smokeless tobacco: Never Used  Substance and Sexual Activity  . Alcohol use: Yes  . Drug use: No  . Sexual activity: Not on file

## 2019-08-10 ENCOUNTER — Encounter: Payer: Self-pay | Admitting: Orthopaedic Surgery

## 2019-08-10 ENCOUNTER — Ambulatory Visit (INDEPENDENT_AMBULATORY_CARE_PROVIDER_SITE_OTHER): Payer: Medicare Other

## 2019-08-10 ENCOUNTER — Other Ambulatory Visit: Payer: Self-pay

## 2019-08-10 ENCOUNTER — Ambulatory Visit (INDEPENDENT_AMBULATORY_CARE_PROVIDER_SITE_OTHER): Payer: Medicare Other | Admitting: Orthopaedic Surgery

## 2019-08-10 DIAGNOSIS — S82001D Unspecified fracture of right patella, subsequent encounter for closed fracture with routine healing: Secondary | ICD-10-CM

## 2019-08-10 NOTE — Progress Notes (Signed)
The patient is 2 weeks status post a mechanical fall injuring her right knee.  The original radiographs suggested a fracture of her right patella that was nondisplaced.  She was placed appropriately in a knee immobilizer.  We did see her 8 days ago and we felt appropriate to see her today to make sure she is doing well.  She says she is ambulating better and she has no real pain with her knee.  On examination of her right knee there is no pain to palpation over the patella.  Her extensor mechanism is intact and she has good flexion-extension of the right knee.  A lateral view of her right knee is obtained and I do not truly see a fracture line.  Based on today's x-ray and her clinical exam findings I am advocating she only wear the knee immobilizer for maybe 1 more week and then she can stop the knee immobilizer.  All question concerns were answered and addressed.  Follow-up can be as needed unless this worsens in any way for her.

## 2020-11-03 IMAGING — RF DG C-ARM 1-60 MIN-NO REPORT
1 series · 2 of 2 positions shown · non-contrast
Comparison: 02/25/2019 at [DATE] p.m.

CLINICAL DATA: Portable operative imaging provided for right hip
arthroplasty.

EXAM:
OPERATIVE RIGHT HIP (WITH PELVIS IF PERFORMED) 2 VIEWS
TECHNIQUE: Fluoroscopic spot image(s) were submitted for interpretation
post-operatively.

[Series 1: unknown protocol · 0.20mm/px · 2 of 2 slices shown]
[im 1/2]
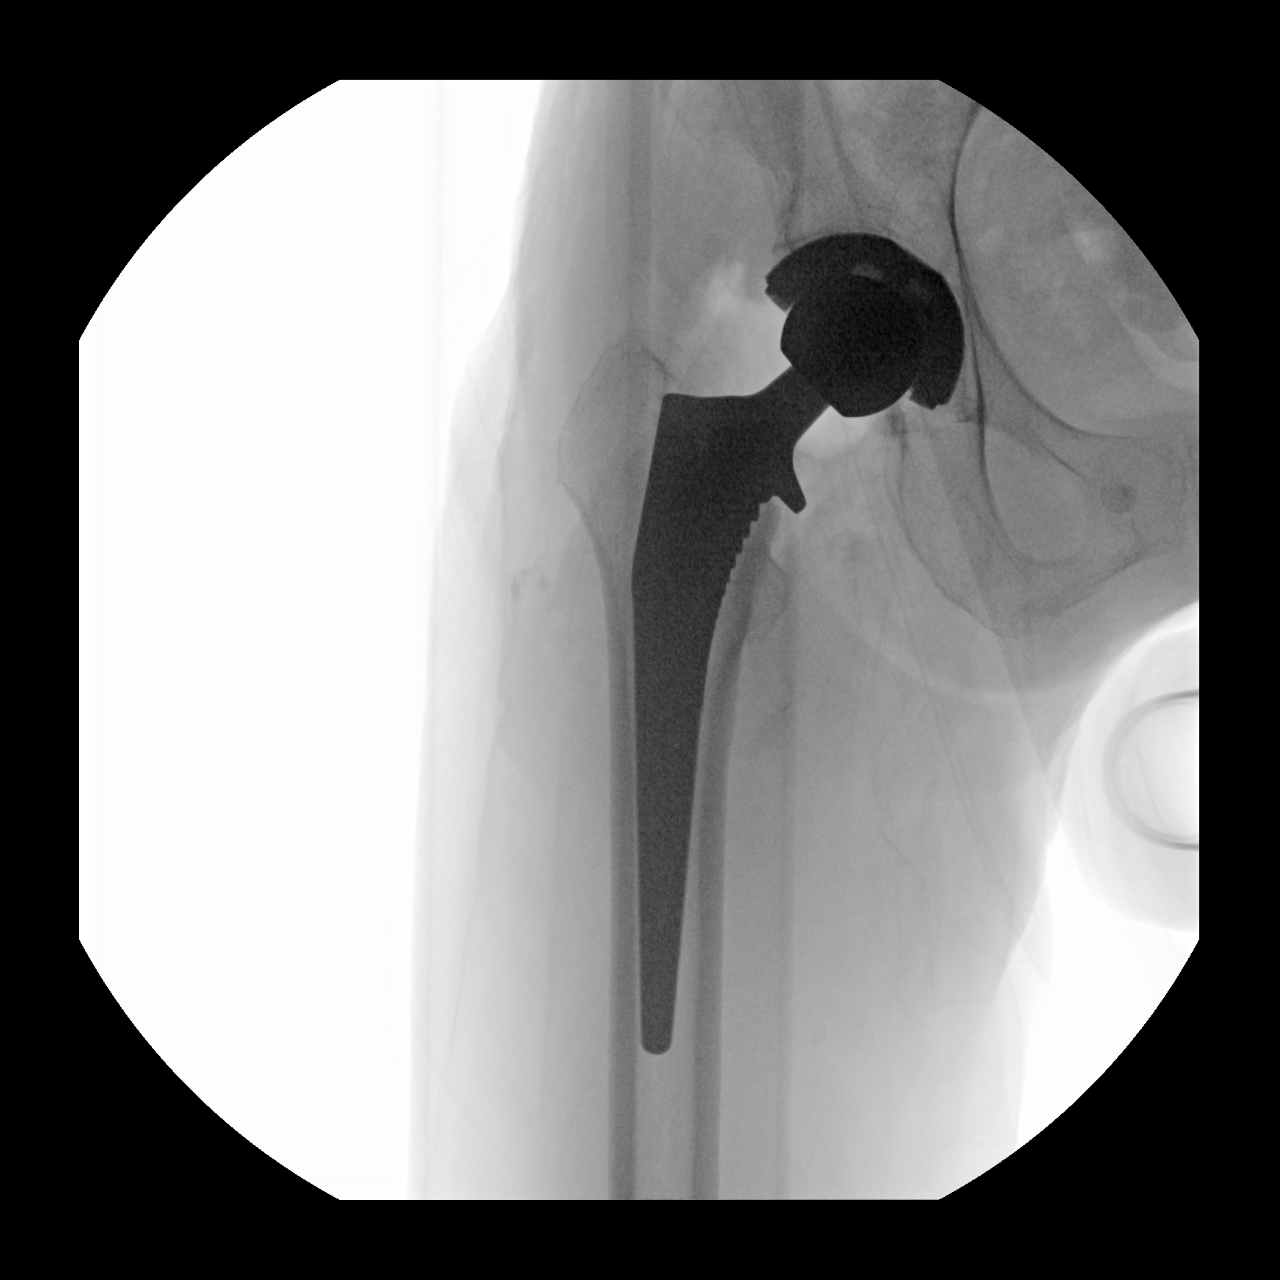
[im 2/2]
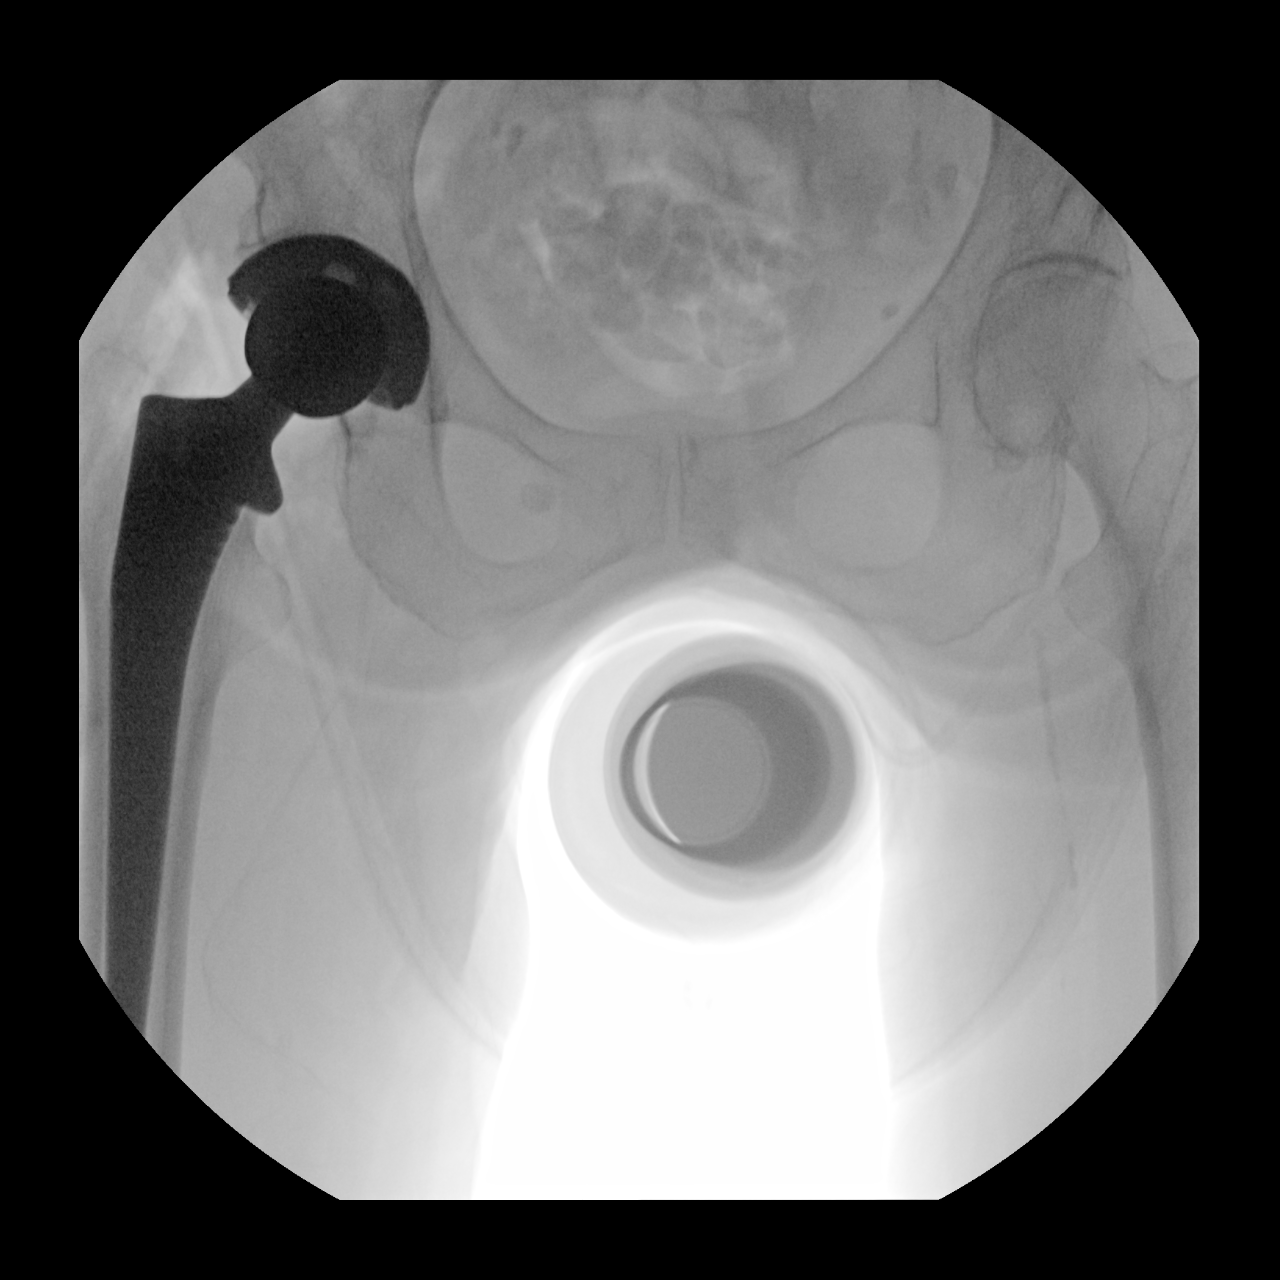

[2 of 2 positions shown; findings below may reference images not displayed]

FINDINGS: Two submitted images show placement of a total right hip
arthroplasty. The femoral and acetabular components appear well
seated and aligned. No evidence of an operative complication.
IMPRESSION: Well-positioned total right hip arthroplasty

## 2021-03-16 ENCOUNTER — Inpatient Hospital Stay
Admit: 2021-03-16 | Disposition: A | Discharge status: Home / Self Care | Payer: MEDICARE | Attending: Undersea and Hyperbaric Medicine

## 2021-03-16 ENCOUNTER — Other Ambulatory Visit

## 2021-03-16 ENCOUNTER — Encounter (INDEPENDENT_AMBULATORY_CARE_PROVIDER_SITE_OTHER)

## 2021-03-16 ENCOUNTER — Ambulatory Visit: Admit: 2021-03-17 | Payer: MEDICARE

## 2021-03-16 VITALS — BP 177/89 | HR 96 | Temp 98.9°F | Resp 18 | Ht 62.0 in | Wt 106.0 lb

## 2021-03-16 DIAGNOSIS — J189 Pneumonia, unspecified organism: Secondary | ICD-10-CM

## 2021-03-16 LAB — POCT SARS-COV-2/FLU A&B
POCT Flu A: NOT DETECTED
POCT Flu B: NOT DETECTED
POCT SARS-CoV-2: NOT DETECTED

## 2021-03-16 MED ORDER — cefdinir (Omnicef) 300 mg capsule
300 | ORAL_CAPSULE | Freq: Two times a day (BID) | ORAL | 0 refills | Status: AC
Start: 2021-03-16 — End: 2021-03-23

## 2021-03-16 MED ORDER — azithromycin (Zithromax) 250 mg tablet
250 | ORAL_TABLET | ORAL | 0 refills | Status: AC
Start: 2021-03-16 — End: 2021-03-21

## 2021-03-16 MED ORDER — predniSONE (Deltasone) 20 mg tablet
20 | ORAL_TABLET | Freq: Every day | ORAL | 0 refills | Status: AC
Start: 2021-03-16 — End: 2021-03-21

## 2021-03-16 MED ORDER — albuterol 90 mcg/actuation inhaler
90 | Freq: Four times a day (QID) | RESPIRATORY_TRACT | 0 refills | Status: AC | PRN
Start: 2021-03-16 — End: ?

## 2021-03-16 NOTE — ED Provider Notes (Signed)
 History  Chief Complaint   Patient presents with   . Shortness of Breath     Pt states here due to shortness of breathe and cough. Symptoms started 1 week ago.      HPI     80 year old female fully vaccinated for COVID-19 including 1 booster.  History of nonoxygen dependent COPD.  Right hip arthroplasty for fracture 2020 in [place] with the patient's from.  Patient moved up to [place] to live with daughter approximately 1 year ago.  Does not have a primary care physician as yet.  Presents to urgent care tonight with 1 week of increasing shortness of breath, cough.  Patient will not take any over-the-counter cough or cold medications.  She does not smoke.  No obvious ill or COVID-19 exposures.  No travel this year.  Has been eating and drinking normal.  No fevers or chills.  No nausea, vomiting or diarrhea.  No chest, back or abdominal pain.  No abnormal leg pain or swelling.  No hematuria or dysuria.    Past Medical History:   Diagnosis Date   . Known health problems: none    COPD    Past Surgical History:   Procedure Laterality Date   . NO PAST SURGERIES     Right hip arthroplasty 2020    No family history on file.    Social History     Tobacco Use   . Smoking status: Former     Types: Cigarettes   . Smokeless tobacco: Never   Substance Use Topics   . Alcohol use: Not on file   . Drug use: Not on file       Review of Systems   Constitutional: Negative for activity change, chills and fever.   HENT: Negative for congestion, ear pain, postnasal drip, rhinorrhea and sore throat.    Eyes: Negative for pain and visual disturbance.   Respiratory: Positive for cough and shortness of breath. Negative for chest tightness.    Cardiovascular: Negative for chest pain and palpitations.   Gastrointestinal: Negative for abdominal pain, constipation, diarrhea and vomiting.   Genitourinary: Negative for dysuria, frequency, hematuria and urgency.   Musculoskeletal: Negative for arthralgias, back pain and myalgias.    Skin: Negative for color change and rash.   Neurological: Negative for seizures and syncope.   Hematological: Does not bruise/bleed easily.   All other systems reviewed and are negative.      Physical Exam  Vitals:    03/16/21 1840 03/16/21 1847 03/16/21 1948   BP:   (!) 177/89   Pulse:  96    Resp:  18    Temp:  37.2 C (98.9 F)    TempSrc:  Oral    SpO2:  (!) 94%    Weight: 48.1 kg     Height: 1.575 m         Physical Exam  Vitals and nursing note (Daughter present) reviewed.   Constitutional:       General: She is not in acute distress.     Appearance: She is well-developed. She is not ill-appearing.   HENT:      Head: Normocephalic and atraumatic.      Mouth/Throat:      Mouth: Mucous membranes are moist.      Pharynx: Oropharynx is clear.      Comments: Upper and lower dentures  Eyes:      Extraocular Movements: Extraocular movements intact.      Conjunctiva/sclera: Conjunctivae normal.  Pupils: Pupils are equal, round, and reactive to light.   Neck:      Vascular: No JVD.      Trachea: No tracheal deviation.   Cardiovascular:      Rate and Rhythm: Normal rate and regular rhythm.      Pulses: Normal pulses.      Heart sounds: Normal heart sounds. No murmur heard.  Pulmonary:      Effort: Pulmonary effort is normal. No accessory muscle usage or respiratory distress.      Comments: Decreased air entry throughout with occasional rhonchi and coarse breath sounds  Abdominal:      General: Bowel sounds are normal.      Palpations: Abdomen is soft. There is no splenomegaly.      Tenderness: There is no abdominal tenderness. There is no guarding or rebound.   Musculoskeletal:         General: No swelling. Normal range of motion.      Cervical back: Normal range of motion and neck supple.   Skin:     General: Skin is warm and dry.      Capillary Refill: Capillary refill takes less than 2 seconds.   Neurological:      Mental Status: She is alert and oriented to person, place, and time.      Cranial Nerves: No  cranial nerve deficit.      Motor: No weakness.   Psychiatric:         Mood and Affect: Mood normal.         Behavior: Behavior normal.              XR CHEST 2 VIEWS   Final Result      Hazy and reticular opacities over both lung bases, which may represent chronic scarring or crowding related to the patient's COPD as opposed interstitial lung disease or pneumonia.      Small hazy opacity in the left lung apex, which may be due to scarring as opposed to pneumonia. Correlation with any previous outside imaging is recommended.      No discrete lobar pneumonia or CHF.      Hendricks Limes, MD 03/16/2021 7:28 PM        Labs Reviewed   POCT SARS-COV-2/FLU A&B - Normal       Result Value    POCT SARS-CoV-2 Not Detected      POCT Flu A Not Detected      POCT Flu B Not Detected      Narrative:     This test was performed with the [facility] SARS-COV-2 & Influenza A/B RNA by RT PCR molecular test. Not detected results do not rule out infection with COVID-19, Influenza A and/or Influenza B. This test is offered under Emergency use Authorization by the FDA.     EKG at 19: 04  Sinus at 99, normal intervals, no acute ischemic changes, no STEMI.  Procedures  Procedures    UC Course  ED Course as of 03/16/21 2005   Sat Mar 16, 2021   1909 Patient and daughter updated regarding negative COVID and flu test as well as EKG.  Have recommended transferring patient to emergency department for further evaluation and management.  Patient refusing at this time.  Therefore patient will agree for chest x-ray while here in urgent care.  I have discussed the potential etiology including but not limited to cardiac, pulmonary, infectious, etc. for patient's symptoms.  And again reiterated about transferring patient to the  emergency department which again patient is refusing.   [CK]      ED Course User Index  [CK] Rosanne Ashing, MD         Diagnoses as of 03/16/21 2005   Shortness of breath   Pneumonia of left upper lobe due to infectious  organism   COPD exacerbation (CMS/HCC)       MDM  Number of Diagnoses or Management Options  COPD exacerbation (CMS/HCC)  Pneumonia of left upper lobe due to infectious organism  Shortness of breath  Diagnosis management comments: 79 year old female from [place] with the above history does not have a primary care.  Presented to urgent with the above history.  Chest x-ray, EKG as documented.  Consideration is that this may be a combination of COPD exacerbation with possible pneumonia patient's been symptomatic for 1 week and still refuses to go to the emergency department despite potential for morbidity, mortality, loss of current lifestyle.  Patient has capacity to understand.  Daughter present.  For the pneumonia have prescribed cefdinir and azithromycin per up-to-date guidelines for community-acquired pneumonia, for COPD I prescribed prednisone and albuterol.  Given numbers to get new primary care physician.  Which may take a few months.        Discharge Meds  ED Prescriptions     Medication Sig Dispense Start Date End Date Auth. Provider    cefdinir (Omnicef) 300 mg capsule Take 1 capsule (300 mg) by mouth in the morning and at bedtime for 7 days. 14 capsule 03/16/2021 03/23/2021 Rosanne Ashing, MD    azithromycin (Zithromax) 250 mg tablet Take 2 tablets (500 mg) by mouth in the morning for 1 day, THEN 1 tablet (250 mg) in the morning for 4 days. 6 tablet 03/16/2021 03/21/2021 Rosanne Ashing, MD    predniSONE (Deltasone) 20 mg tablet Take 2 tablets (40 mg) by mouth in the morning for 5 days. 10 tablet 03/16/2021 03/21/2021 Rosanne Ashing, MD    albuterol 90 mcg/actuation inhaler Inhale 2 puffs every 6 (six) hours if needed for wheezing. 18 g 03/16/2021 -- Rosanne Ashing, MD          Home Meds  Prior to Admission medications    Not on File         Total amount of time spent on day of service doing chart review, history and physical exam, order/ review results of testing ordered (if  any), patient counseling, documentation: 45 minutes.      Patient encounter note may have been created using voice recognition software and in real time during the office visit. Please excuse any typographical errors that may not have been edited out.               Rosanne Ashing, MD  03/16/21 2005

## 2021-03-16 NOTE — Discharge Instructions (Addendum)
 Antibiotics as prescribed for possible left pneumonia, prednisone and albuterol for your COPD, use Mucinex, maintain good nutrition hydration.  Any worsening symptoms of the next 24 hours proceed directly to the closest emergency department or call 911.  To get a new primary care physician call [phone].

## 2023-02-27 DEATH — deceased
